# Patient Record
Sex: Female | Born: 1990 | Race: White | Hispanic: No | Marital: Married | Smoking: Never smoker
Health system: Southern US, Community
[De-identification: ages and names within clinical notes are randomized; demographics above are authoritative.]

## PROBLEM LIST (undated history)

## (undated) ENCOUNTER — Inpatient Hospital Stay (HOSPITAL_COMMUNITY): Payer: Self-pay

## (undated) DIAGNOSIS — Z86718 Personal history of other venous thrombosis and embolism: Secondary | ICD-10-CM

## (undated) DIAGNOSIS — D6859 Other primary thrombophilia: Secondary | ICD-10-CM

## (undated) HISTORY — PX: NO PAST SURGERIES: SHX2092

---

## 2008-11-10 DIAGNOSIS — Z86718 Personal history of other venous thrombosis and embolism: Secondary | ICD-10-CM

## 2008-11-10 HISTORY — DX: Personal history of other venous thrombosis and embolism: Z86.718

## 2009-05-24 ENCOUNTER — Ambulatory Visit (HOSPITAL_COMMUNITY): Admission: RE | Admit: 2009-05-24 | Discharge: 2009-05-24 | Payer: Self-pay | Admitting: Obstetrics and Gynecology

## 2009-06-21 ENCOUNTER — Ambulatory Visit (HOSPITAL_COMMUNITY): Admission: RE | Admit: 2009-06-21 | Discharge: 2009-06-21 | Payer: Self-pay | Admitting: Obstetrics and Gynecology

## 2009-07-12 ENCOUNTER — Ambulatory Visit (HOSPITAL_COMMUNITY): Admission: RE | Admit: 2009-07-12 | Discharge: 2009-07-12 | Payer: Self-pay | Admitting: Obstetrics and Gynecology

## 2009-07-25 ENCOUNTER — Inpatient Hospital Stay (HOSPITAL_COMMUNITY): Admission: RE | Admit: 2009-07-25 | Discharge: 2009-07-27 | Payer: Self-pay | Admitting: Obstetrics & Gynecology

## 2009-07-25 ENCOUNTER — Encounter: Payer: Self-pay | Admitting: Advanced Practice Midwife

## 2009-09-24 ENCOUNTER — Ambulatory Visit: Payer: Self-pay | Admitting: Vascular Surgery

## 2009-09-25 ENCOUNTER — Inpatient Hospital Stay (HOSPITAL_COMMUNITY): Admission: EM | Admit: 2009-09-25 | Discharge: 2009-10-03 | Payer: Self-pay | Admitting: Emergency Medicine

## 2009-10-09 ENCOUNTER — Ambulatory Visit (HOSPITAL_COMMUNITY): Admission: RE | Admit: 2009-10-09 | Discharge: 2009-10-09 | Payer: Self-pay | Admitting: Interventional Radiology

## 2010-01-18 ENCOUNTER — Ambulatory Visit (HOSPITAL_COMMUNITY): Admission: RE | Admit: 2010-01-18 | Discharge: 2010-01-18 | Payer: Self-pay | Admitting: Obstetrics and Gynecology

## 2010-05-09 ENCOUNTER — Ambulatory Visit: Payer: Self-pay | Admitting: Oncology

## 2010-05-22 LAB — CBC WITH DIFFERENTIAL/PLATELET
BASO%: 0.6 % (ref 0.0–2.0)
Eosinophils Absolute: 0.1 10*3/uL (ref 0.0–0.5)
LYMPH%: 37.1 % (ref 14.0–49.7)
MCH: 29.5 pg (ref 25.1–34.0)
MCHC: 35 g/dL (ref 31.5–36.0)
MCV: 84.2 fL (ref 79.5–101.0)
MONO%: 7.7 % (ref 0.0–14.0)
Platelets: 337 10*3/uL (ref 145–400)
RBC: 4.57 10*6/uL (ref 3.70–5.45)

## 2010-05-22 LAB — PROTHROMBIN TIME: Prothrombin Time: 23.3 seconds — ABNORMAL HIGH (ref 11.6–15.2)

## 2010-05-26 LAB — HYPERCOAGULABLE PANEL, COMPREHENSIVE
Anticardiolipin IgA: 1 APL U/mL (ref <22)
Anticardiolipin IgG: 9 GPL U/mL (ref <23)
Anticardiolipin IgM: 1 MPL U/mL (ref <11)
Beta-2-Glycoprotein I IgA: 1 A Units (ref <20)
Beta-2-Glycoprotein I IgM: 0 M Units (ref <20)
PTTLA 4:1 Mix: 44.3 secs (ref 30.0–45.6)
Protein C, Total: 60 % — ABNORMAL LOW (ref 70–140)

## 2010-05-26 LAB — COMPREHENSIVE METABOLIC PANEL
AST: 16 U/L (ref 0–37)
BUN: 10 mg/dL (ref 6–23)
Calcium: 9.7 mg/dL (ref 8.4–10.5)
Chloride: 105 mEq/L (ref 96–112)
Creatinine, Ser: 0.58 mg/dL (ref 0.40–1.20)

## 2010-05-26 LAB — FACTOR 8 ASSAY: Coagulation Factor VIII: 54 % — ABNORMAL LOW (ref 73–140)

## 2010-05-29 ENCOUNTER — Ambulatory Visit: Payer: Self-pay | Admitting: Vascular Surgery

## 2010-05-29 ENCOUNTER — Encounter: Payer: Self-pay | Admitting: Oncology

## 2010-05-29 ENCOUNTER — Ambulatory Visit: Admission: RE | Admit: 2010-05-29 | Discharge: 2010-05-29 | Payer: Self-pay | Admitting: Oncology

## 2010-07-01 ENCOUNTER — Emergency Department (HOSPITAL_COMMUNITY): Admission: EM | Admit: 2010-07-01 | Discharge: 2010-07-01 | Payer: Self-pay | Admitting: Emergency Medicine

## 2010-07-02 ENCOUNTER — Ambulatory Visit: Payer: Self-pay | Admitting: Vascular Surgery

## 2010-07-02 ENCOUNTER — Encounter (INDEPENDENT_AMBULATORY_CARE_PROVIDER_SITE_OTHER): Payer: Self-pay | Admitting: Emergency Medicine

## 2010-07-02 ENCOUNTER — Ambulatory Visit (HOSPITAL_COMMUNITY): Admission: RE | Admit: 2010-07-02 | Discharge: 2010-07-02 | Payer: Self-pay | Admitting: Emergency Medicine

## 2010-07-26 ENCOUNTER — Ambulatory Visit: Payer: Self-pay | Admitting: Oncology

## 2010-11-25 IMAGING — US US OB FOLLOW-UP
1 series · 14 of 28 positions shown · non-contrast
Comparison: none

OBSTETRICAL ULTRASOUND:
 This ultrasound was performed in The [HOSPITAL], and the [facility] report will be stored to [REDACTED] PACS.

[Series 1: us ob follow-up · 14 of 31 slices shown]
[im 2/31]
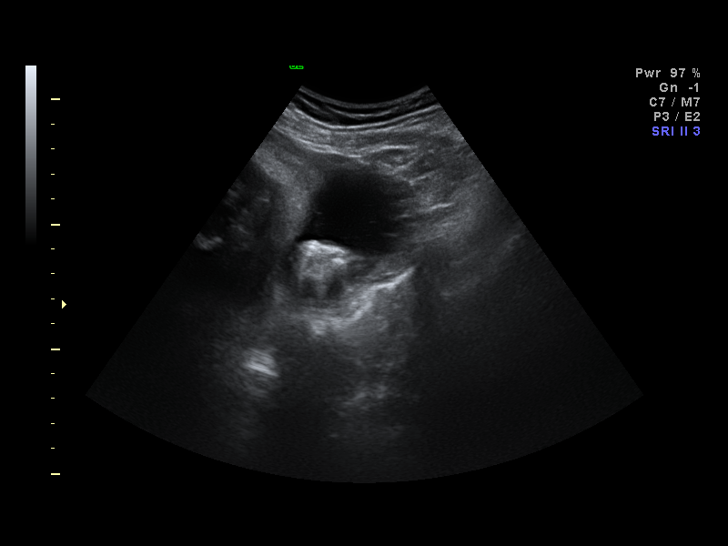
[im 4/31]
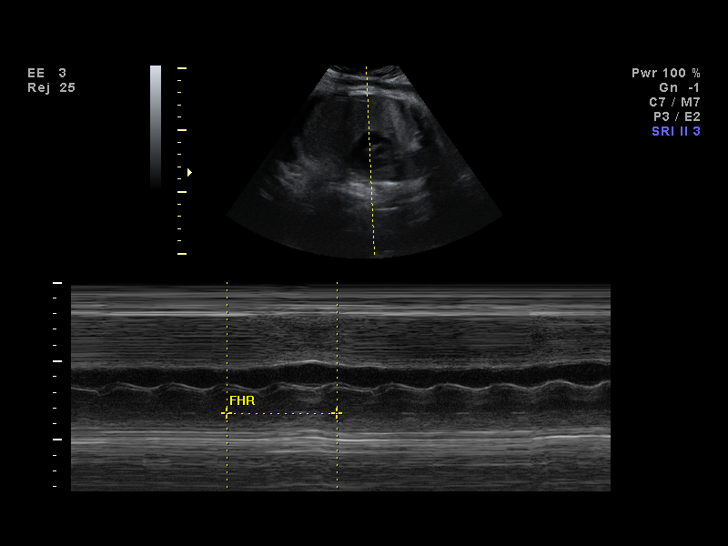
[im 6/31]
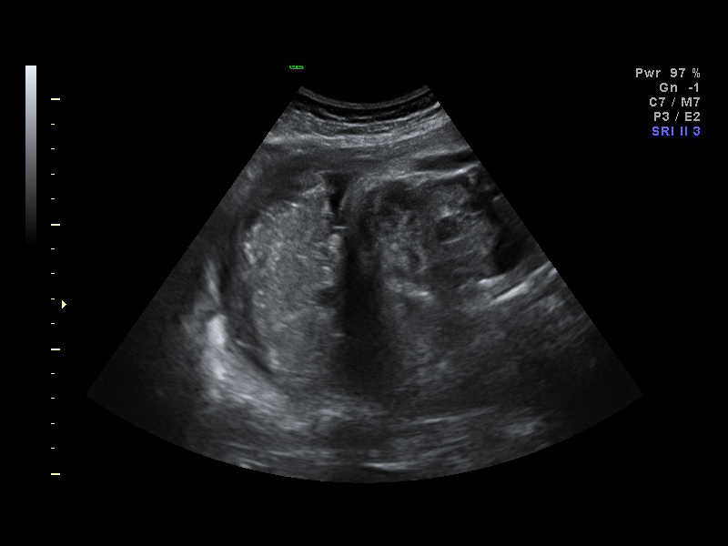
[im 8/31]
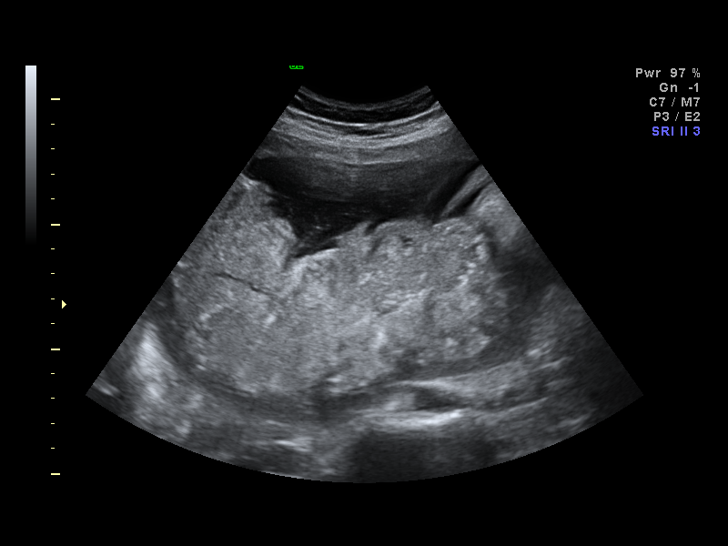
[im 11/31]
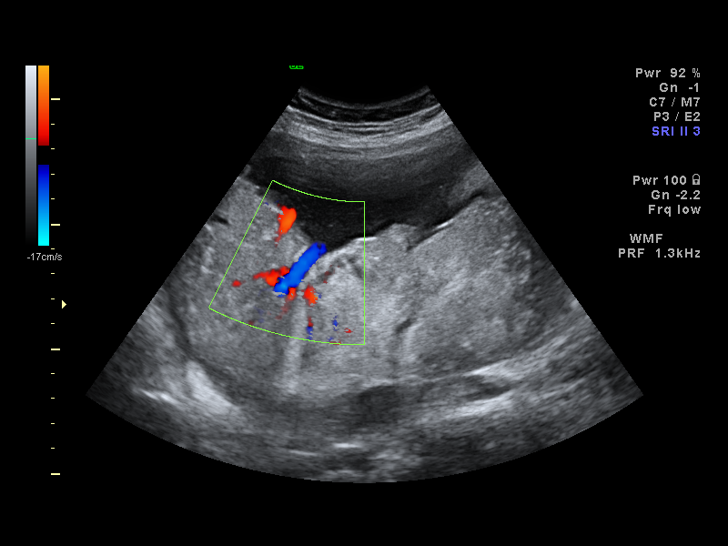
[im 13/31]
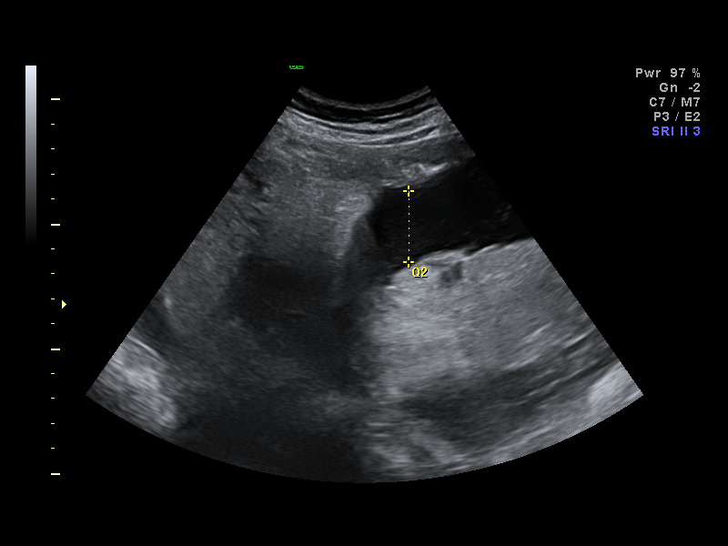
[im 15/31]
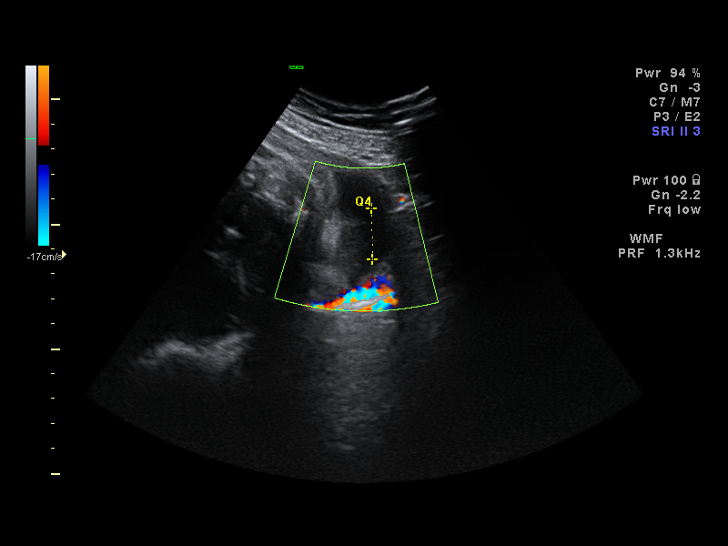
[im 17/31]
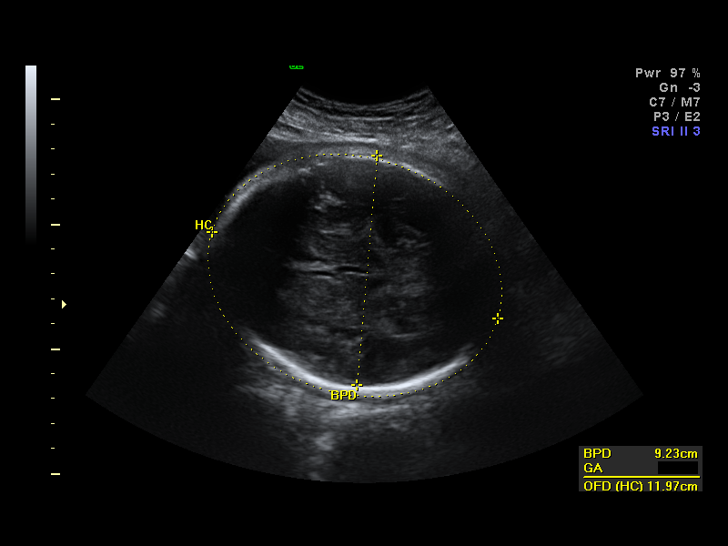
[im 19/31]
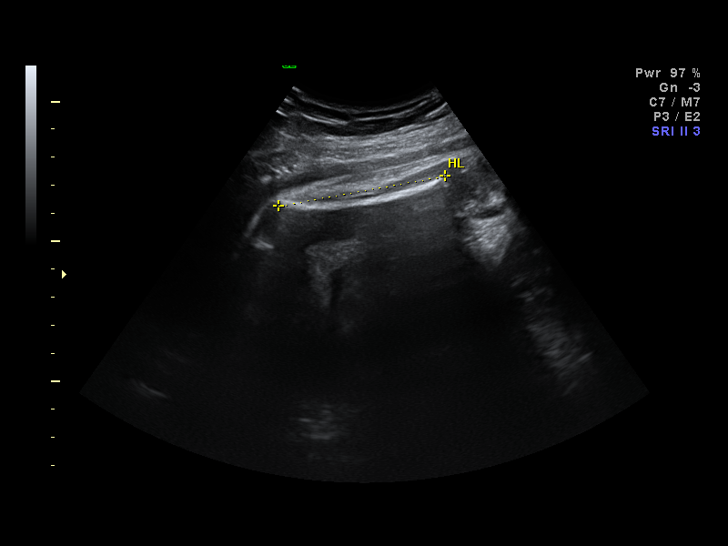
[im 22/31]
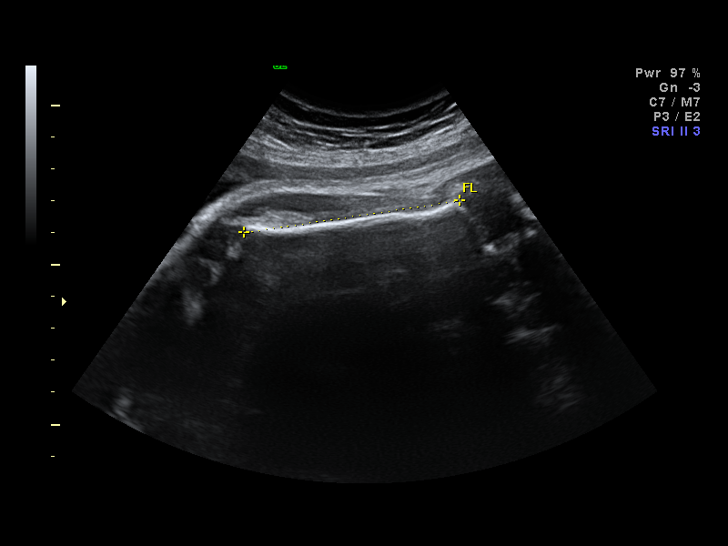
[im 24/31]
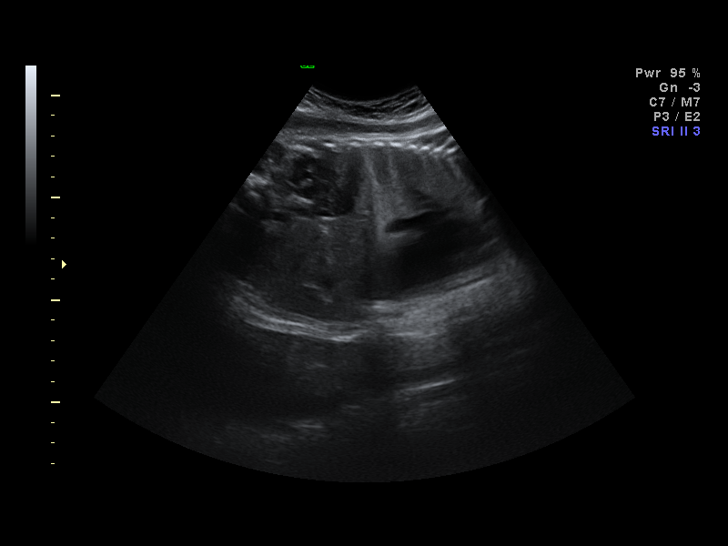
[im 26/31]
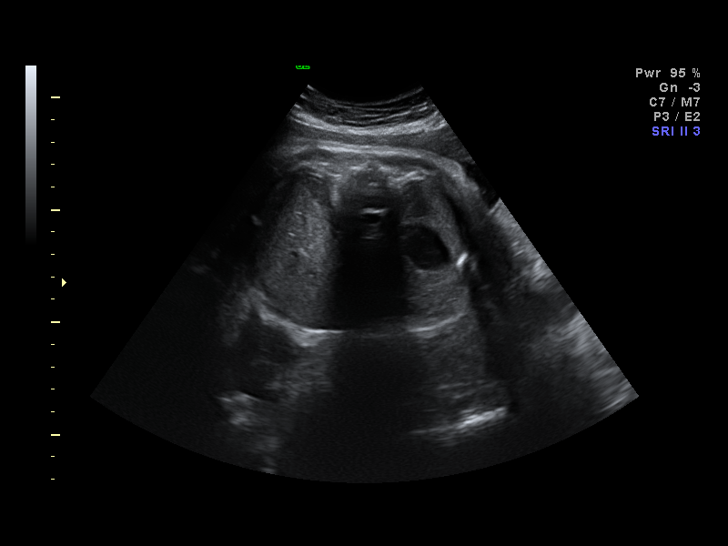
[im 28/31]
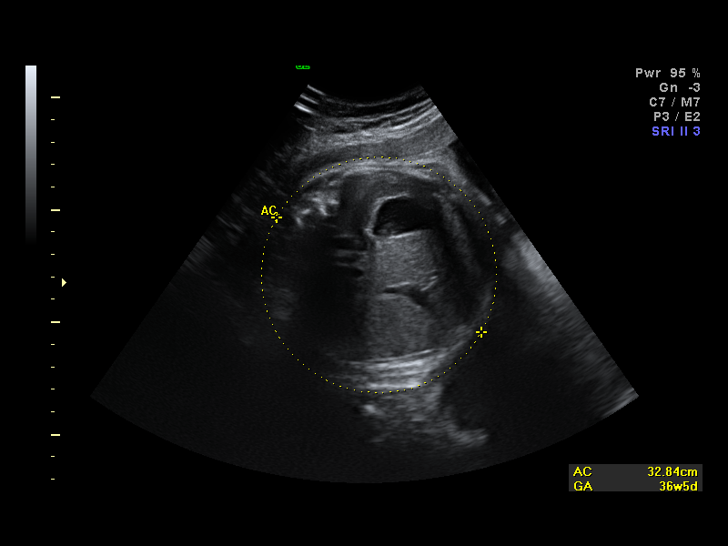
[im 31/31]
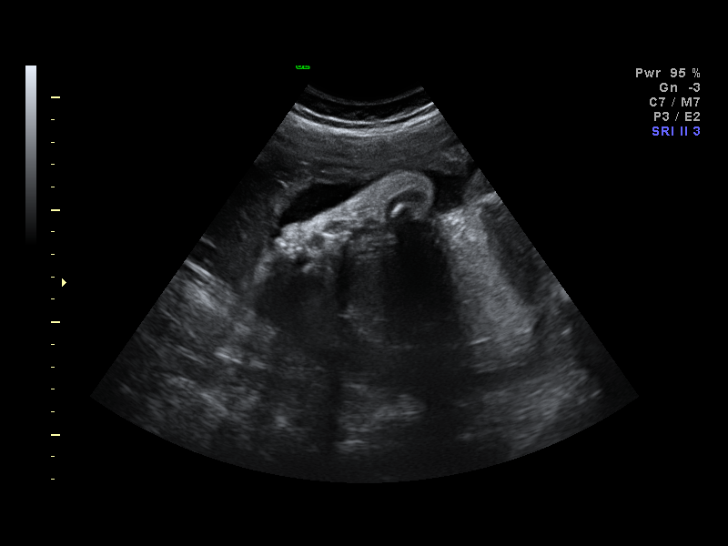

[14 of 28 positions shown; findings below may reference images not displayed]

IMPRESSION: [facility]N has also been faxed to the ordering physician.

## 2010-11-30 ENCOUNTER — Other Ambulatory Visit (HOSPITAL_COMMUNITY): Payer: Self-pay | Admitting: Obstetrics and Gynecology

## 2010-11-30 DIAGNOSIS — Z86718 Personal history of other venous thrombosis and embolism: Secondary | ICD-10-CM

## 2010-11-30 DIAGNOSIS — D6859 Other primary thrombophilia: Secondary | ICD-10-CM

## 2010-11-30 DIAGNOSIS — Z0489 Encounter for examination and observation for other specified reasons: Secondary | ICD-10-CM

## 2010-12-01 ENCOUNTER — Encounter: Payer: Self-pay | Admitting: Interventional Radiology

## 2010-12-12 ENCOUNTER — Ambulatory Visit (HOSPITAL_COMMUNITY)
Admission: RE | Admit: 2010-12-12 | Discharge: 2010-12-12 | Disposition: A | Discharge status: Home / Self Care | Source: Ambulatory Visit | Attending: Obstetrics and Gynecology | Admitting: Obstetrics and Gynecology

## 2010-12-12 ENCOUNTER — Encounter (HOSPITAL_COMMUNITY): Payer: Self-pay

## 2010-12-12 ENCOUNTER — Other Ambulatory Visit (HOSPITAL_COMMUNITY): Payer: Self-pay | Admitting: Maternal and Fetal Medicine

## 2010-12-12 DIAGNOSIS — O358XX0 Maternal care for other (suspected) fetal abnormality and damage, not applicable or unspecified: Secondary | ICD-10-CM | POA: Insufficient documentation

## 2010-12-12 DIAGNOSIS — I82409 Acute embolism and thrombosis of unspecified deep veins of unspecified lower extremity: Secondary | ICD-10-CM | POA: Insufficient documentation

## 2010-12-12 DIAGNOSIS — IMO0002 Reserved for concepts with insufficient information to code with codable children: Secondary | ICD-10-CM

## 2010-12-12 DIAGNOSIS — Z363 Encounter for antenatal screening for malformations: Secondary | ICD-10-CM | POA: Insufficient documentation

## 2010-12-12 DIAGNOSIS — Z1389 Encounter for screening for other disorder: Secondary | ICD-10-CM | POA: Insufficient documentation

## 2010-12-12 DIAGNOSIS — Z86718 Personal history of other venous thrombosis and embolism: Secondary | ICD-10-CM

## 2010-12-12 DIAGNOSIS — D6859 Other primary thrombophilia: Secondary | ICD-10-CM

## 2010-12-12 DIAGNOSIS — Z0489 Encounter for examination and observation for other specified reasons: Secondary | ICD-10-CM

## 2011-01-02 ENCOUNTER — Ambulatory Visit (HOSPITAL_COMMUNITY)
Admission: RE | Admit: 2011-01-02 | Discharge: 2011-01-02 | Disposition: A | Discharge status: Home / Self Care | Source: Ambulatory Visit | Attending: Obstetrics and Gynecology | Admitting: Obstetrics and Gynecology

## 2011-01-02 ENCOUNTER — Other Ambulatory Visit (HOSPITAL_COMMUNITY): Payer: Self-pay | Admitting: Obstetrics and Gynecology

## 2011-01-02 DIAGNOSIS — O358XX0 Maternal care for other (suspected) fetal abnormality and damage, not applicable or unspecified: Secondary | ICD-10-CM

## 2011-01-02 DIAGNOSIS — Z3689 Encounter for other specified antenatal screening: Secondary | ICD-10-CM | POA: Insufficient documentation

## 2011-01-02 DIAGNOSIS — IMO0002 Reserved for concepts with insufficient information to code with codable children: Secondary | ICD-10-CM

## 2011-01-07 ENCOUNTER — Ambulatory Visit (HOSPITAL_COMMUNITY)

## 2011-01-24 ENCOUNTER — Ambulatory Visit (HOSPITAL_COMMUNITY)
Admission: RE | Admit: 2011-01-24 | Discharge: 2011-01-24 | Disposition: A | Discharge status: Home / Self Care | Source: Ambulatory Visit | Attending: Obstetrics and Gynecology | Admitting: Obstetrics and Gynecology

## 2011-01-24 DIAGNOSIS — O358XX0 Maternal care for other (suspected) fetal abnormality and damage, not applicable or unspecified: Secondary | ICD-10-CM | POA: Insufficient documentation

## 2011-01-24 DIAGNOSIS — D6859 Other primary thrombophilia: Secondary | ICD-10-CM | POA: Insufficient documentation

## 2011-01-24 DIAGNOSIS — D689 Coagulation defect, unspecified: Secondary | ICD-10-CM | POA: Insufficient documentation

## 2011-01-31 ENCOUNTER — Ambulatory Visit (HOSPITAL_COMMUNITY)

## 2011-02-10 IMAGING — XA IR ANGIO/EXISTING CATHETER
1 series · 16 of 24 positions shown · non-contrast
Comparison: none

CLINICAL DATA: 39 hours status post initiation of transcatheter
thrombolytic therapy to treat left-sided iliofemoral DVT.  An IVC
filter was also placed on 09/26/2009.

[Series 1: run · 16 of 192 slices shown]
[im 1/192]
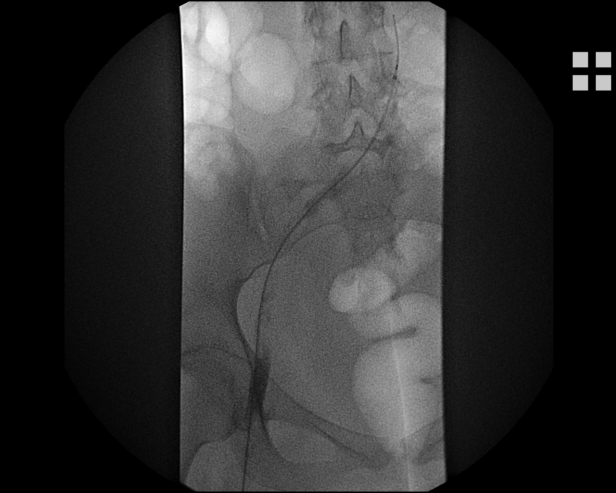
[im 17/192]
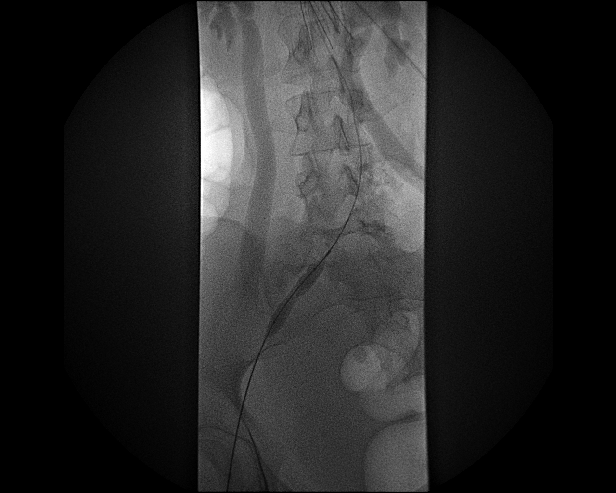
[im 25/192]
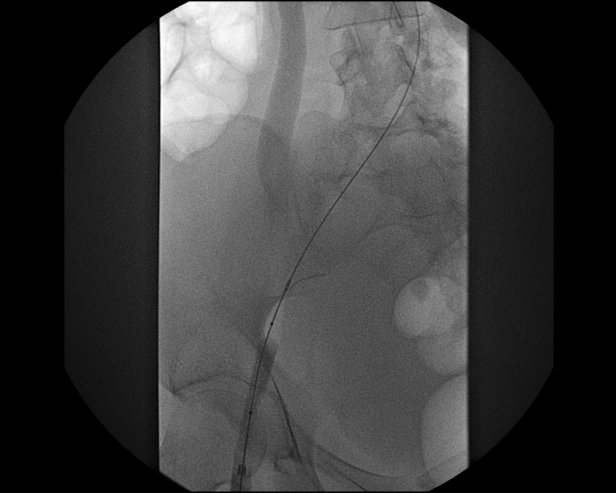
[im 42/192]
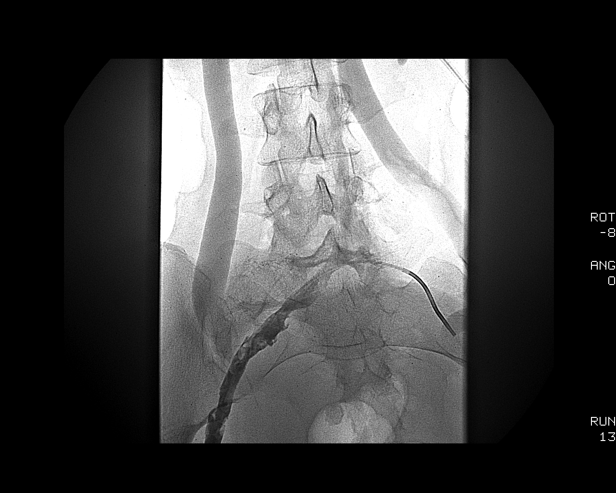
[im 50/192]
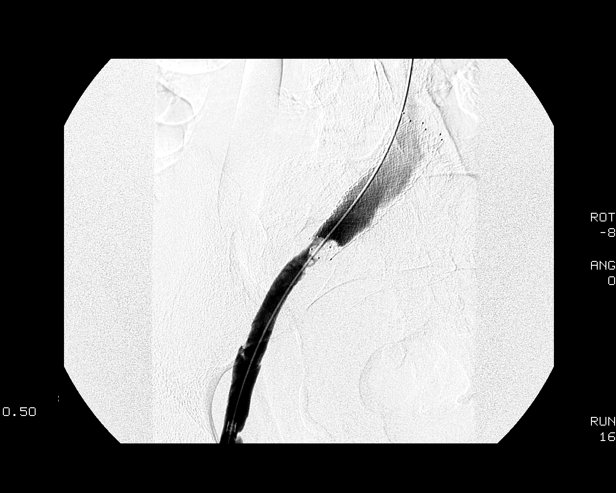
[im 67/192]
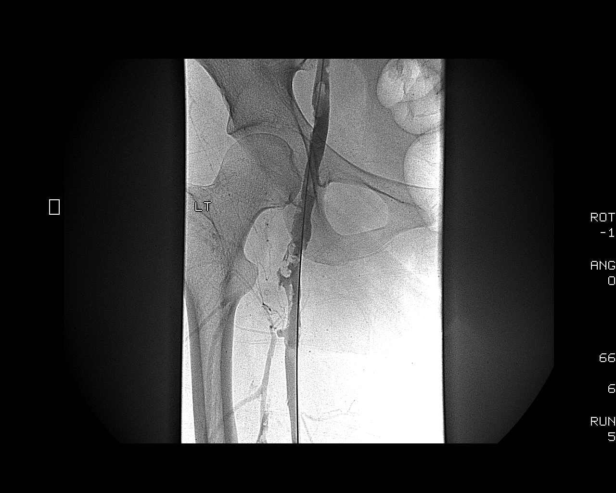
[im 75/192]
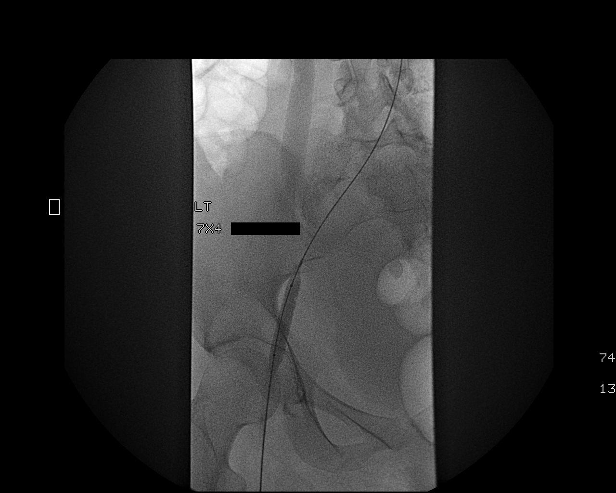
[im 92/192]
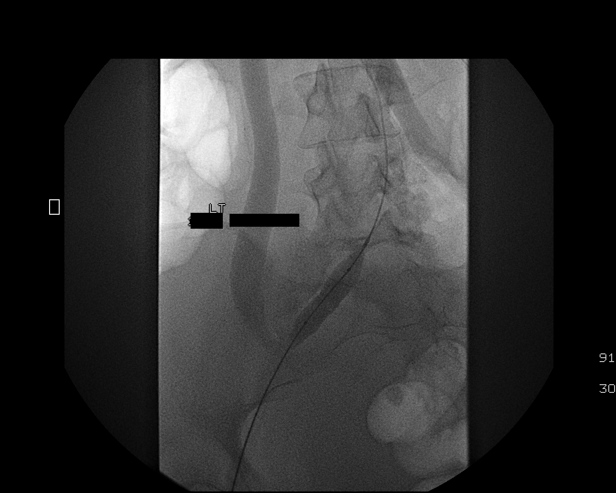
[im 100/192]
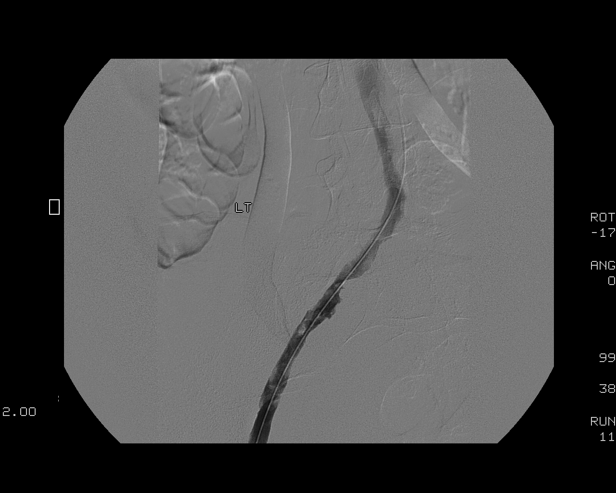
[im 117/192]
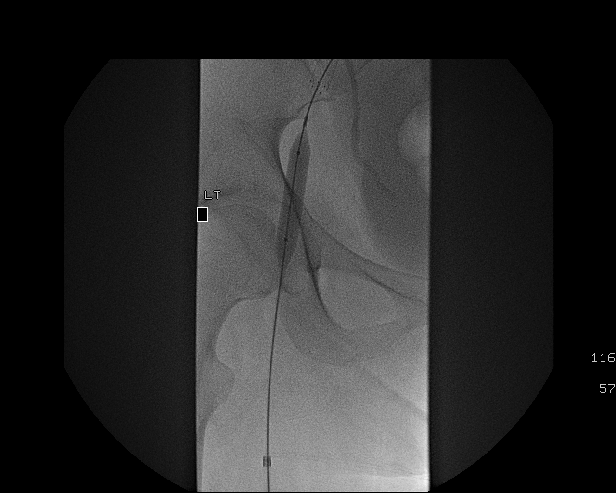
[im 125/192]
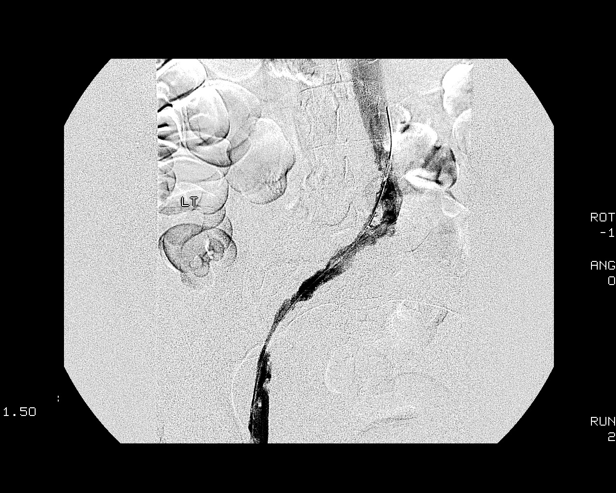
[im 142/192]
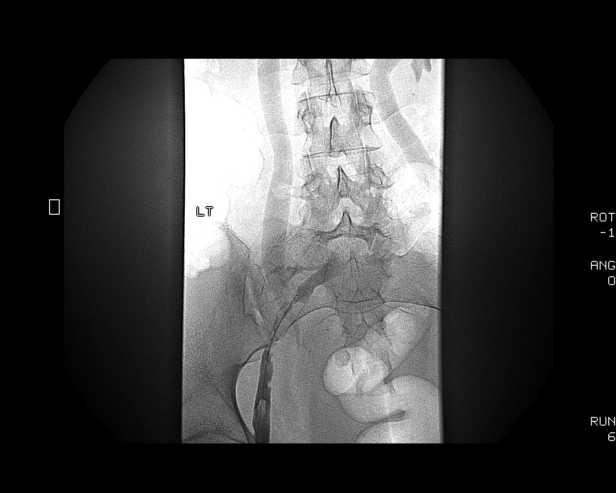
[im 150/192]
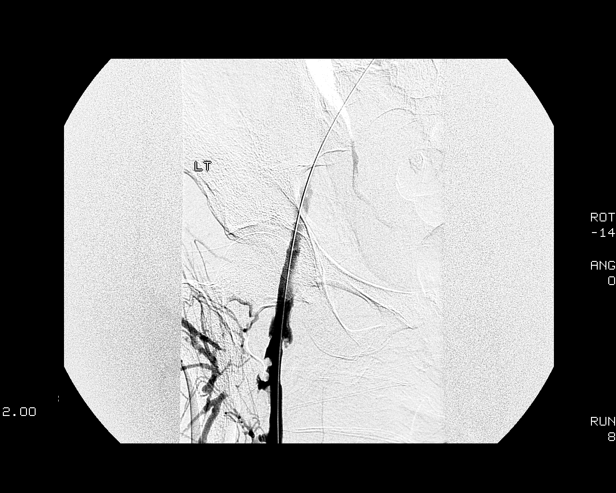
[im 167/192]
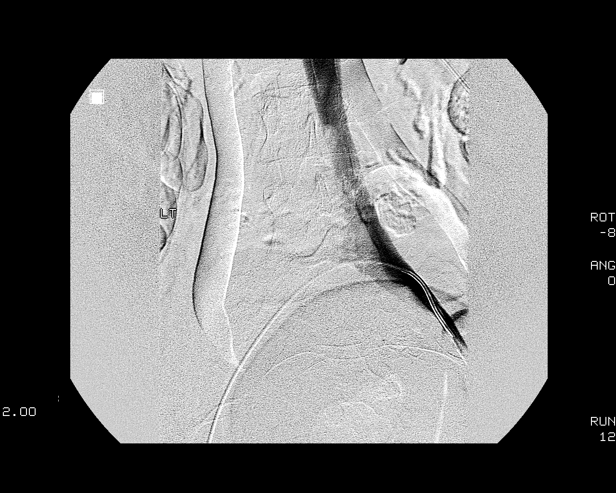
[im 175/192]
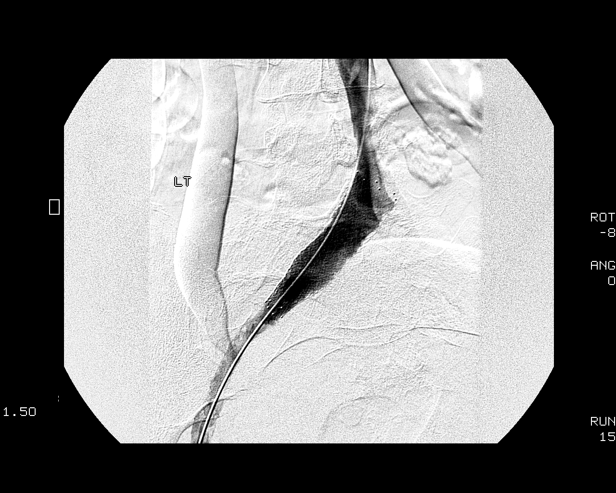
[im 192/192]
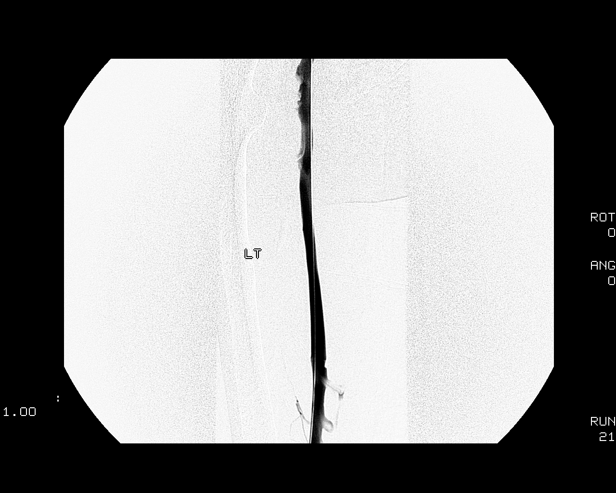

[16 of 24 positions shown; findings below may reference images not displayed]

[DATE].  FOLLOW-UP ANGIOGRAPHY DURING TRANSCATHETER THROMBOLYTIC THERAPY
2.  MECHANICAL THROMBECTOMY OF LEFT FEMORAL AND ILIAC VEINS
3.  VENOUS ANGIOPLASTY OF THE LEFT FEMORAL AND ILIAC VEINS
4.  INTRAVASCULAR STENT PLACEMENT IN THE LEFT ILIAC VEINS

Sedation: 4.5 mg IV Versed; 200 mcg IV Fentanyl.

Total Moderate Sedation Time: 118 minutes.

Contrast:  110 ml Rmnipaque-XMM

Additional Medications: 100 mcg intravenous nitroglycerin

Fluoroscopy Time: 18.8 minutes.

Procedure:  The procedure, risks, benefits, and alternatives were
explained to the patient.  Questions regarding the procedure were
encouraged and answered.  The patient understands and consents to
the procedure.

The popliteal infusion catheter and sheath was prepped with
betadine in a sterile fashion, and a sterile drape was applied
covering the operative field.  A sterile gown and sterile gloves
were used for the procedure. Local anesthesia was provided with 1%
Lidocaine.

Initial venography was performed through the preexisting infusion
catheter as well as injection of the popliteal sheath.  The
infusion catheter was removed over a guidewire.  Mechanical
thrombectomy was performed in the superficial femoral vein, common
femoral vein and iliac veins with the AngioJet device.

Balloon angioplasty was performed at the level of the superficial
femoral vein, common femoral vein and iliac veins initially with a
7 mm x 4 cm Conquest balloon.  The iliac veins were also dilated
with a 10 mm x 4 cm Conquest balloon.  Additional venography was
performed including injection of the right common iliac vein after
advancing a catheter over a guidewire.

Intravascular stent placement was performed at the level of the
left common iliac vein with deployment of a 12 mm x 60 mm Smart
stent.  This was followed by overlapping placement of a 10 mm x 60
mm Smart stent extending into the upper external iliac vein.  Part
of the stented segment was dilated with a 9 mm balloon.

After the procedure the popliteal sheath and right jugular sheath
were removed with hemostasis obtained by manual compression.

Complications: None
FINDINGS: Initial venogram shows significant improved patency of
the deep veins.  Patent flow is now present in the superficial
femoral vein and common femoral vein.  There is nonocclusive
thrombus remaining in the femoral vein just below the
saphenofemoral junction.  Nonocclusive thrombus was also present in
the common femoral vein.  The iliac veins show chronic stenosis of
the common iliac vein and external iliac vein with very poor flow
and residual chronic thrombus.

Some of the residual thrombus in the superficial femoral vein and
common femoral vein did clear with mechanical thrombectomy.  After
additional balloon angioplasty, stenosis of the common iliac vein
showed no improvement.  Intravascular stent placement was therefore
performed.  Overlapping stent was placed.  This extending into the
external iliac vein to treat stenosis and associated chronic
thrombus.  After stent placement, the iliac veins show
significantly improved patency.  Improved flow was also present.

Initial venogram also showed some tilting of the IVC filter placed
yesterday.  The apex of the filter is tilted into the orifice of
the left renal vein.  No significant thrombus was identified in the
filter itself.  The filter will currently be left in place.  After
anticoagulation and discharge from the hospital, elective removal
of the filter will be scheduled.
IMPRESSION: Significant improvement in venous patency after additional
thrombolytic therapy, mechanical thrombectomy, angioplasty and
stenting as above.  Chronic stenosis of the common iliac vein had
to be stented and shows significant improvement.  Thrombolytic
therapy was discontinued.  Additional clinical follow-up will be
performed prior to retrieval of the IVC filter.

## 2011-02-12 LAB — PROTIME-INR
INR: 1.57 — ABNORMAL HIGH (ref 0.00–1.49)
INR: 2.54 — ABNORMAL HIGH (ref 0.00–1.49)
Prothrombin Time: 14.3 seconds (ref 11.6–15.2)
Prothrombin Time: 15.5 seconds — ABNORMAL HIGH (ref 11.6–15.2)
Prothrombin Time: 17.6 seconds — ABNORMAL HIGH (ref 11.6–15.2)
Prothrombin Time: 18.6 seconds — ABNORMAL HIGH (ref 11.6–15.2)

## 2011-02-12 LAB — URINALYSIS, ROUTINE W REFLEX MICROSCOPIC
Hgb urine dipstick: NEGATIVE
Leukocytes, UA: NEGATIVE
Nitrite: NEGATIVE
Protein, ur: 30 mg/dL — AB
Specific Gravity, Urine: >1.046 — ABNORMAL HIGH (ref 1.005–1.030)
Urobilinogen, UA: 0.2 mg/dL (ref 0.0–1.0)
pH: 6 (ref 5.0–8.0)

## 2011-02-12 LAB — CBC
HCT: 24.6 % — ABNORMAL LOW (ref 36.0–46.0)
HCT: 26.4 % — ABNORMAL LOW (ref 36.0–46.0)
HCT: 29 % — ABNORMAL LOW (ref 36.0–46.0)
HCT: 29.1 % — ABNORMAL LOW (ref 36.0–46.0)
Hemoglobin: 7.5 g/dL — ABNORMAL LOW (ref 12.0–15.0)
Hemoglobin: 7.8 g/dL — ABNORMAL LOW (ref 12.0–15.0)
Hemoglobin: 7.9 g/dL — ABNORMAL LOW (ref 12.0–15.0)
Hemoglobin: 8 g/dL — ABNORMAL LOW (ref 12.0–15.0)
Hemoglobin: 8.1 g/dL — ABNORMAL LOW (ref 12.0–15.0)
Hemoglobin: 9.8 g/dL — ABNORMAL LOW (ref 12.0–15.0)
Hemoglobin: 9.9 g/dL — ABNORMAL LOW (ref 12.0–15.0)
MCHC: 33.6 g/dL (ref 30.0–36.0)
MCHC: 33.7 g/dL (ref 30.0–36.0)
MCHC: 33.8 g/dL (ref 30.0–36.0)
MCHC: 33.8 g/dL (ref 30.0–36.0)
MCHC: 34.2 g/dL (ref 30.0–36.0)
MCHC: 34.3 g/dL (ref 30.0–36.0)
MCHC: 34.3 g/dL (ref 30.0–36.0)
MCHC: 34.3 g/dL (ref 30.0–36.0)
MCV: 79.2 fL (ref 78.0–100.0)
MCV: 80.1 fL (ref 78.0–100.0)
MCV: 80.2 fL (ref 78.0–100.0)
MCV: 80.4 fL (ref 78.0–100.0)
MCV: 80.4 fL (ref 78.0–100.0)
Platelets: 277 10*3/uL (ref 150–400)
Platelets: 322 10*3/uL (ref 150–400)
Platelets: 335 10*3/uL (ref 150–400)
RBC: 2.79 MIL/uL — ABNORMAL LOW (ref 3.87–5.11)
RBC: 2.9 MIL/uL — ABNORMAL LOW (ref 3.87–5.11)
RBC: 2.93 MIL/uL — ABNORMAL LOW (ref 3.87–5.11)
RBC: 2.93 MIL/uL — ABNORMAL LOW (ref 3.87–5.11)
RBC: 3.01 MIL/uL — ABNORMAL LOW (ref 3.87–5.11)
RBC: 3.67 MIL/uL — ABNORMAL LOW (ref 3.87–5.11)
RDW: 15.9 % — ABNORMAL HIGH (ref 11.5–15.5)
RDW: 16 % — ABNORMAL HIGH (ref 11.5–15.5)
RDW: 16.2 % — ABNORMAL HIGH (ref 11.5–15.5)
RDW: 16.3 % — ABNORMAL HIGH (ref 11.5–15.5)
WBC: 11.1 10*3/uL — ABNORMAL HIGH (ref 4.0–10.5)
WBC: 6.9 10*3/uL (ref 4.0–10.5)
WBC: 7.9 10*3/uL (ref 4.0–10.5)
WBC: 7.9 10*3/uL (ref 4.0–10.5)
WBC: 8.1 10*3/uL (ref 4.0–10.5)

## 2011-02-12 LAB — BASIC METABOLIC PANEL
BUN: 2 mg/dL — ABNORMAL LOW (ref 6–23)
BUN: 2 mg/dL — ABNORMAL LOW (ref 6–23)
BUN: <1 mg/dL — ABNORMAL LOW (ref 6–23)
CO2: 19 mEq/L (ref 19–32)
CO2: 23 mEq/L (ref 19–32)
Calcium: 7.9 mg/dL — ABNORMAL LOW (ref 8.4–10.5)
Calcium: 8.1 mg/dL — ABNORMAL LOW (ref 8.4–10.5)
Calcium: 9.7 mg/dL (ref 8.4–10.5)
Chloride: 101 mEq/L (ref 96–112)
Chloride: 107 mEq/L (ref 96–112)
Creatinine, Ser: 0.37 mg/dL — ABNORMAL LOW (ref 0.4–1.2)
Creatinine, Ser: 0.52 mg/dL (ref 0.4–1.2)
GFR calc Af Amer: >60 mL/min (ref >60)
GFR calc Af Amer: >60 mL/min (ref >60)
GFR calc Af Amer: >60 mL/min (ref >60)
GFR calc non Af Amer: >60 mL/min (ref >60)
GFR calc non Af Amer: >60 mL/min (ref >60)
Glucose, Bld: 95 mg/dL (ref 70–99)
Glucose, Bld: 97 mg/dL (ref 70–99)
Potassium: 3.1 mEq/L — ABNORMAL LOW (ref 3.5–5.1)
Potassium: 3.8 mEq/L (ref 3.5–5.1)
Potassium: 4.3 mEq/L (ref 3.5–5.1)
Sodium: 134 mEq/L — ABNORMAL LOW (ref 135–145)
Sodium: 138 mEq/L (ref 135–145)

## 2011-02-12 LAB — URINE MICROSCOPIC-ADD ON

## 2011-02-12 LAB — TSH: TSH: 2.696 u[IU]/mL (ref 0.700–6.400)

## 2011-02-12 LAB — HEPARIN LEVEL (UNFRACTIONATED)
Heparin Unfractionated: 0.16 IU/mL — ABNORMAL LOW (ref 0.30–0.70)
Heparin Unfractionated: 0.59 IU/mL (ref 0.30–0.70)
Heparin Unfractionated: <0.1 IU/mL — ABNORMAL LOW (ref 0.30–0.70)
Heparin Unfractionated: <0.1 IU/mL — ABNORMAL LOW (ref 0.30–0.70)

## 2011-02-12 LAB — URINE CULTURE

## 2011-02-12 LAB — FIBRINOGEN: Fibrinogen: 194 mg/dL — ABNORMAL LOW (ref 204–475)

## 2011-02-12 LAB — POCT PREGNANCY, URINE: Preg Test, Ur: NEGATIVE

## 2011-02-12 LAB — MRSA PCR SCREENING: MRSA by PCR: NEGATIVE

## 2011-02-12 LAB — MAGNESIUM: Magnesium: 1.5 mg/dL (ref 1.5–2.5)

## 2011-02-14 LAB — CBC
HCT: 27 % — ABNORMAL LOW (ref 36.0–46.0)
HCT: 33.9 % — ABNORMAL LOW (ref 36.0–46.0)
Hemoglobin: 10.8 g/dL — ABNORMAL LOW (ref 12.0–15.0)
MCV: 81.4 fL (ref 78.0–100.0)
MCV: 83.6 fL (ref 78.0–100.0)
Platelets: 283 10*3/uL (ref 150–400)
Platelets: 319 10*3/uL (ref 150–400)
RBC: 3.31 MIL/uL — ABNORMAL LOW (ref 3.87–5.11)
RDW: 14.8 % (ref 11.5–15.5)
WBC: 12.3 10*3/uL — ABNORMAL HIGH (ref 4.0–10.5)

## 2011-02-14 LAB — APTT: aPTT: 27 seconds (ref 24–37)

## 2011-02-14 LAB — RPR: RPR Ser Ql: NONREACTIVE

## 2012-05-17 IMAGING — US US OB FOLLOW-UP
1 series · 14 of 28 positions shown · non-contrast
Comparison: none

[Series 1: us ob follow-up · 14 of 63 slices shown]
[im 3/63]
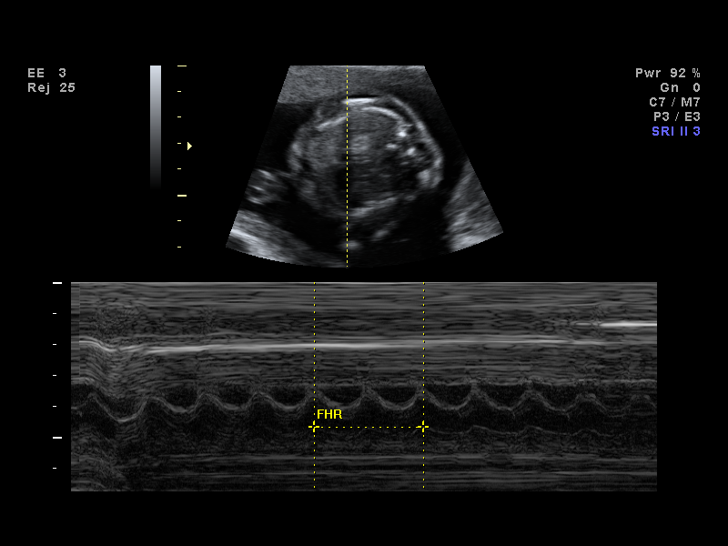
[im 7/63]
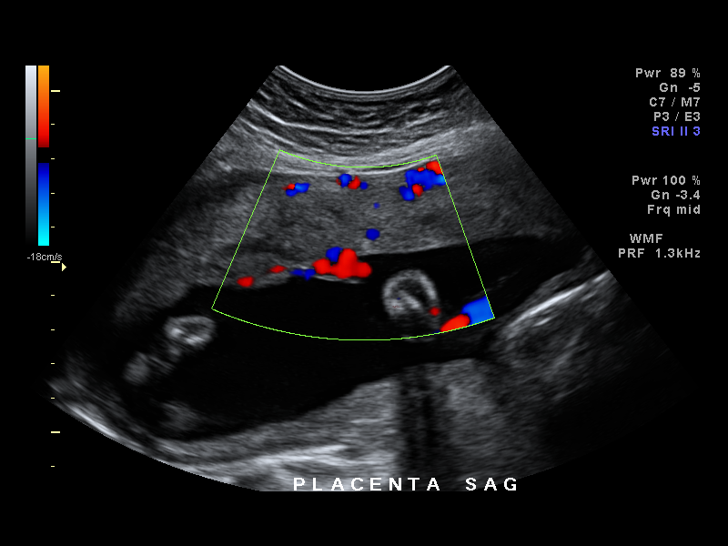
[im 12/63]
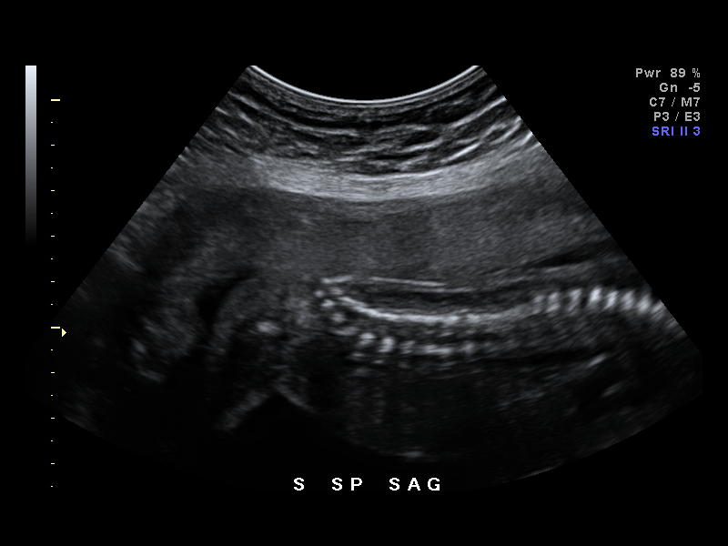
[im 17/63]
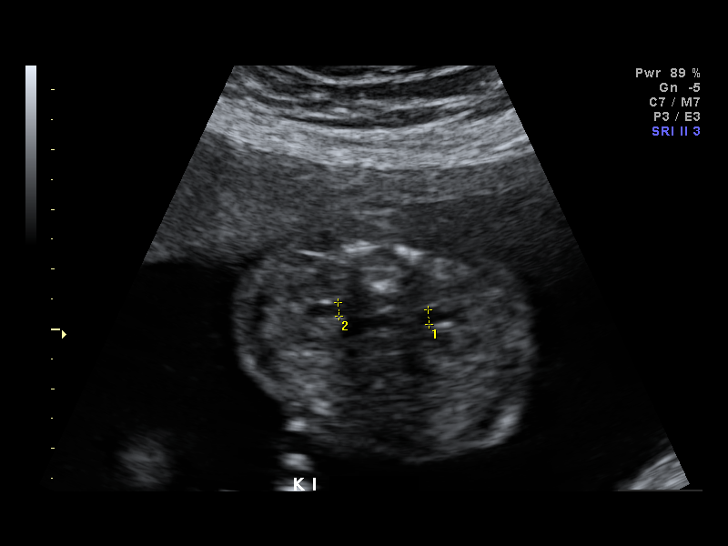
[im 21/63]
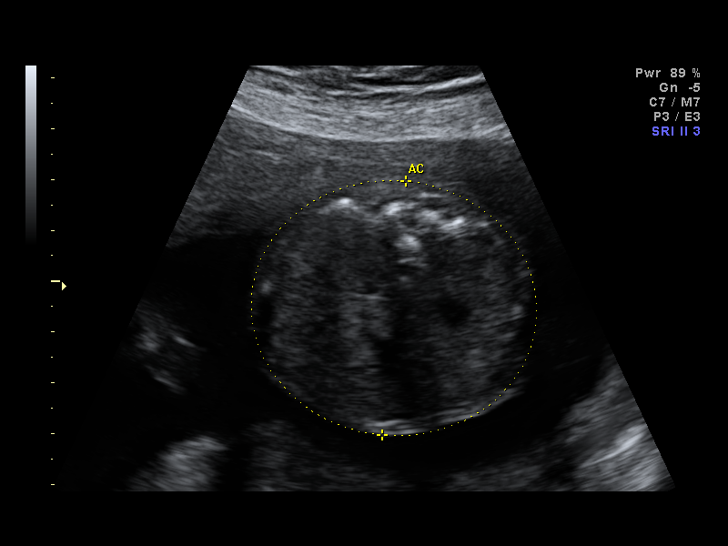
[im 26/63]
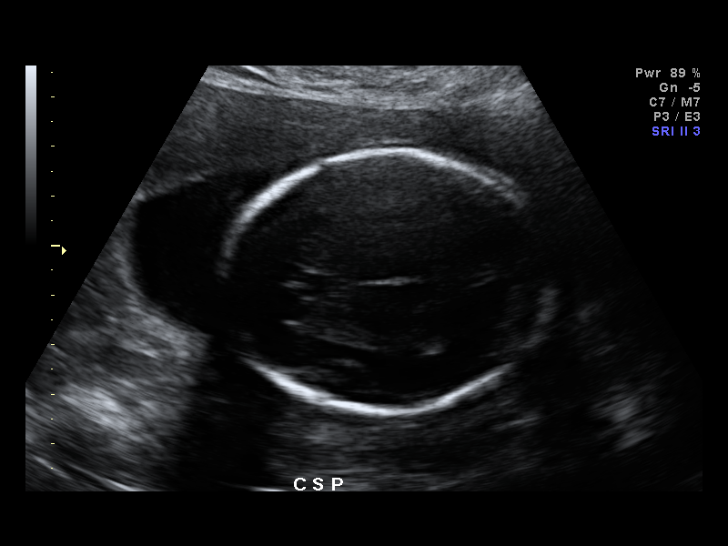
[im 30/63]
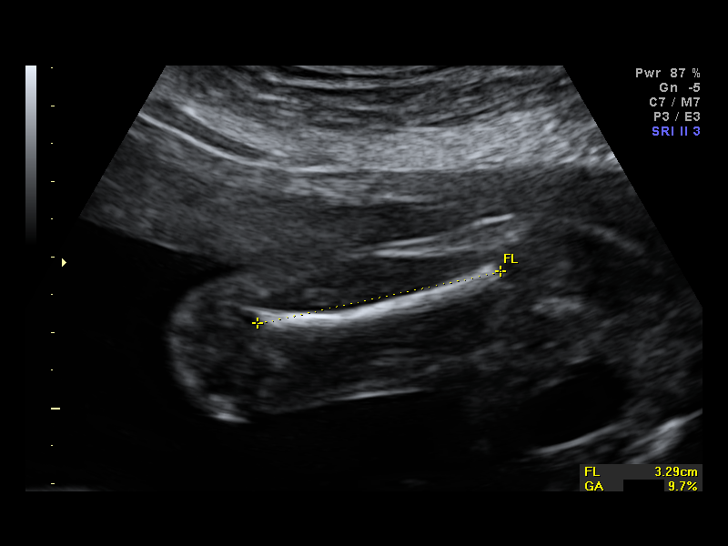
[im 35/63]
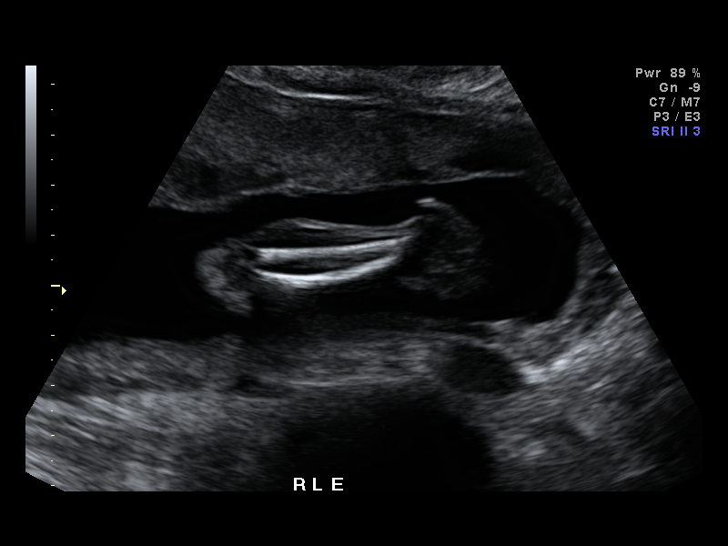
[im 40/63]
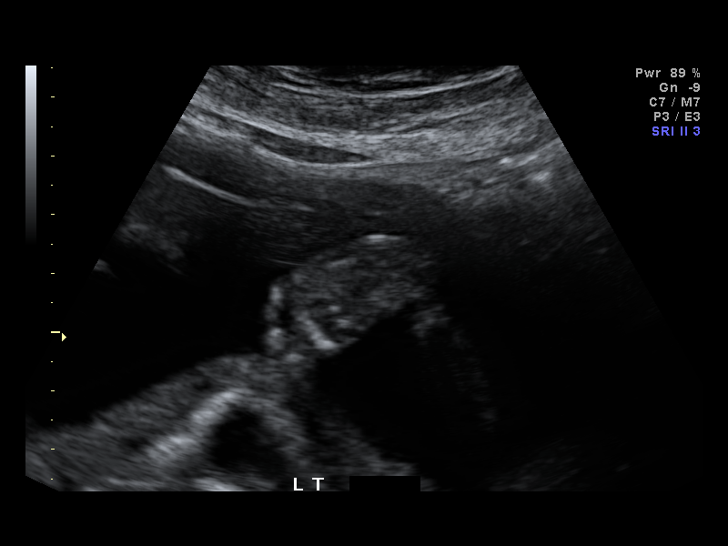
[im 44/63]
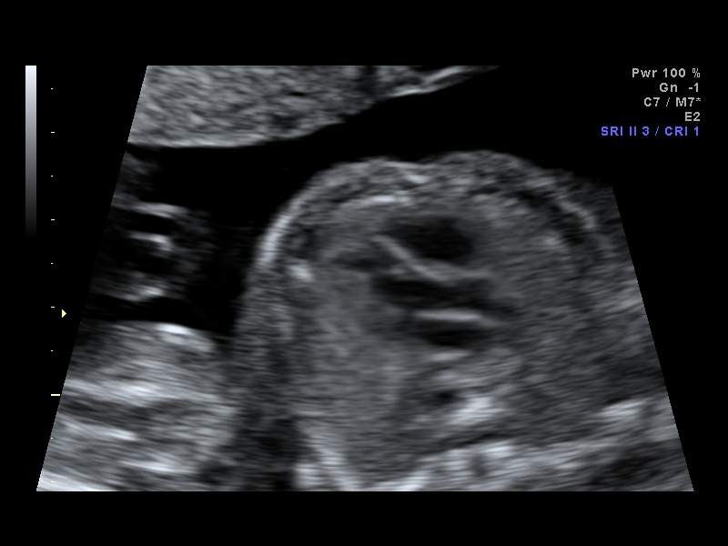
[im 49/63]
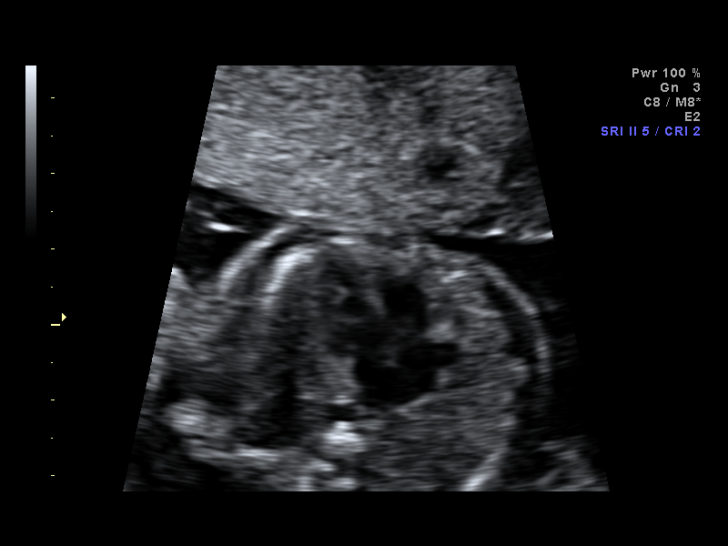
[im 53/63]
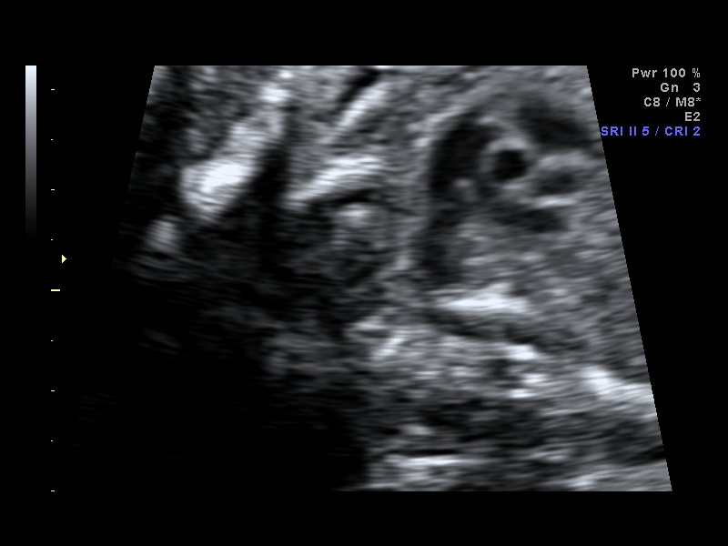
[im 58/63]
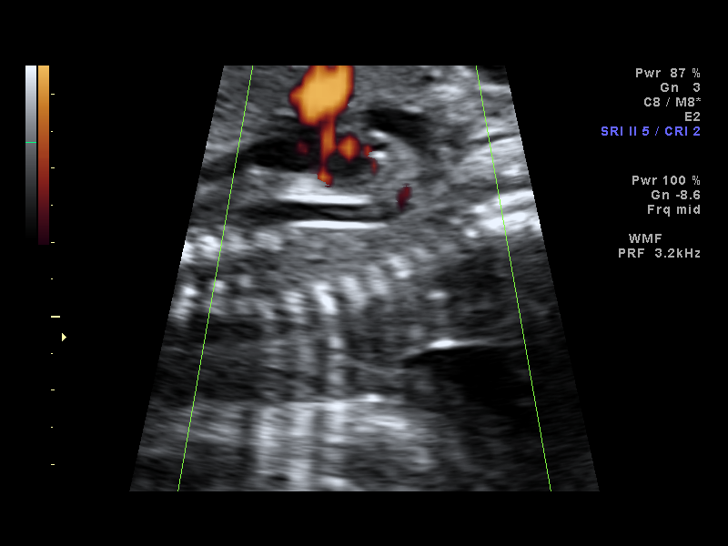
[im 63/63]
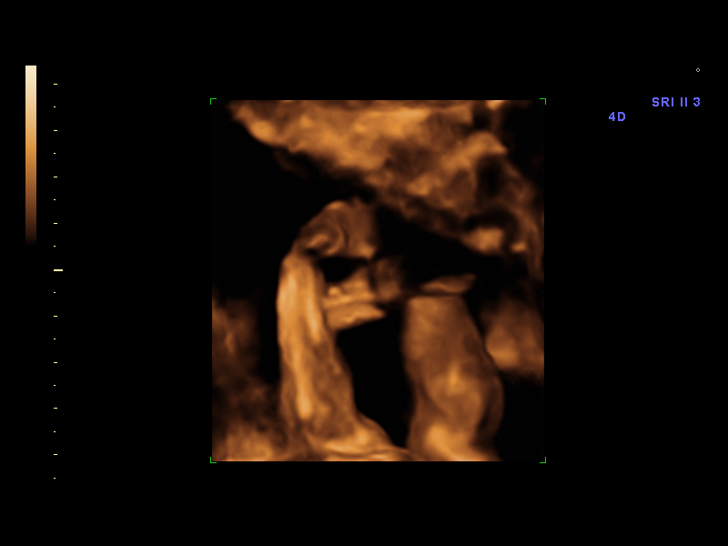

[14 of 28 positions shown; findings below may reference images not displayed]

Canned report from images found in remote index.

Refer to host system for actual result text.

## 2014-06-11 ENCOUNTER — Inpatient Hospital Stay (HOSPITAL_COMMUNITY)

## 2014-06-11 ENCOUNTER — Inpatient Hospital Stay (HOSPITAL_COMMUNITY)
Admission: AD | Admit: 2014-06-11 | Discharge: 2014-06-11 | Disposition: A | Discharge status: Home / Self Care | Source: Ambulatory Visit | Attending: Obstetrics and Gynecology | Admitting: Obstetrics and Gynecology

## 2014-06-11 ENCOUNTER — Encounter (HOSPITAL_COMMUNITY): Payer: Self-pay | Admitting: *Deleted

## 2014-06-11 DIAGNOSIS — D689 Coagulation defect, unspecified: Secondary | ICD-10-CM | POA: Insufficient documentation

## 2014-06-11 DIAGNOSIS — Z86718 Personal history of other venous thrombosis and embolism: Secondary | ICD-10-CM | POA: Insufficient documentation

## 2014-06-11 DIAGNOSIS — O209 Hemorrhage in early pregnancy, unspecified: Secondary | ICD-10-CM | POA: Insufficient documentation

## 2014-06-11 DIAGNOSIS — O99119 Other diseases of the blood and blood-forming organs and certain disorders involving the immune mechanism complicating pregnancy, unspecified trimester: Secondary | ICD-10-CM

## 2014-06-11 DIAGNOSIS — O4692 Antepartum hemorrhage, unspecified, second trimester: Secondary | ICD-10-CM

## 2014-06-11 DIAGNOSIS — Z862 Personal history of diseases of the blood and blood-forming organs and certain disorders involving the immune mechanism: Secondary | ICD-10-CM

## 2014-06-11 HISTORY — DX: Other primary thrombophilia: D68.59

## 2014-06-11 LAB — URINALYSIS, ROUTINE W REFLEX MICROSCOPIC
Bilirubin Urine: NEGATIVE
GLUCOSE, UA: NEGATIVE mg/dL
KETONES UR: NEGATIVE mg/dL
LEUKOCYTES UA: NEGATIVE
Nitrite: NEGATIVE
PROTEIN: 30 mg/dL — AB
Specific Gravity, Urine: 1.015 (ref 1.005–1.030)
Urobilinogen, UA: 1 mg/dL (ref 0.0–1.0)
pH: 7.5 (ref 5.0–8.0)

## 2014-06-11 LAB — URINE MICROSCOPIC-ADD ON

## 2014-06-11 NOTE — MAU Provider Note (Signed)
  History     CSN: [id number]  Arrival date and time: 06/11/14 0945   None     Chief Complaint  Patient presents with  . Vaginal Bleeding   HPI Comments: [id number] @17 .1 wks by early sono c/o bright red bleeding in underwear around 0900 this am. Some mild cramping since arrival. No recent intercourse. No falls or abd trauma. 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 [place] ([place] being followed by MFM there, no Hematologist here yet.    Vaginal Bleeding Associated symptoms include back pain.    OB History   Grav Para Term Preterm Abortions TAB SAB Ect Mult Living   4 3 3       3       Past Medical History  Diagnosis Date  . Protein S deficiency     Past Surgical History  Procedure Laterality Date  . No past surgeries      History reviewed. No pertinent family history.  History  Substance Use Topics  . Smoking status: Never Smoker   . Smokeless tobacco: Never Used  . Alcohol Use: No    Allergies: No Known Allergies  Prescriptions prior to admission  Medication Sig Dispense Refill  . acetaminophen (TYLENOL) 500 MG tablet Take 1,000 mg by mouth every 6 (six) hours as needed for headache.      . enoxaparin (LOVENOX) 60 MG/0.6ML injection Inject 60 mg into the skin every 12 (twelve) hours.      . Prenatal Vit-Fe Fumarate-FA (PRENATAL MULTIVITAMIN) TABS tablet Take 1 tablet by mouth daily at 12 noon.        Review of Systems  Constitutional: Negative.   HENT: Negative.   Eyes: Negative.   Respiratory: Negative.   Cardiovascular: Negative.   Gastrointestinal: Negative.   Genitourinary: Positive for vaginal bleeding.       +VB +cramping  Musculoskeletal: Positive for back pain.  Skin: Negative.   Neurological: Negative.   Endo/Heme/Allergies: Negative.   Psychiatric/Behavioral: Negative.    Physical Exam   Blood pressure 118/79, pulse 103, temperature 98.1 F  (36.7 C), temperature source Oral, resp. rate 18, last menstrual period 02/10/2014, not currently breastfeeding.  Physical Exam  Constitutional: She is oriented to person, place, and time. She appears well-developed and well-nourished.  HENT:  Head: Normocephalic.  Neck: Normal range of motion.  Cardiovascular: Normal rate.   Respiratory: Effort normal.  GI: Soft. There is no tenderness.  Genitourinary: Vagina normal.  Speculum: scant brown blood, cervix closed SVE: 0/0/high  Musculoskeletal: Normal range of motion.  Neurological: She is alert and oriented to person, place, and time.  Skin: Skin is warm and dry.  Psychiatric: She has a normal mood and affect.    MAU Course  Procedures UA-WNL Sono-WNL: placenta above os, CL 3.3cm, central CI, no evidence of bleeding (prelim. report)   Assessment and Plan  17.[redacted] weeks gestation Vaginal bleeding, second trimester Rh positive  Discharge home SAB/bleeding precautions Hematology consult- office will arrange Continue Lovenox at current dose [facility]  Assessment and plan discussed with Dr. Finis Budousins  Taegan Standage, N 06/11/2014, 11:18 AM

## 2014-06-11 NOTE — MAU Note (Signed)
Pt states this am went to restroom and went to void and saw blood in underwear. No clots noted. Has blood clotting disorder. Low back pain as well as mild "period" pains in lower abd.Did not see any blood when voiding in MAU

## 2014-06-11 NOTE — Discharge Instructions (Signed)
Vaginal Bleeding During Pregnancy, Second Trimester °A small amount of bleeding (spotting) from the vagina is relatively common in pregnancy. It usually stops on its own. Various things can cause bleeding or spotting in pregnancy. Some bleeding may be related to the pregnancy, and some may not. Sometimes the bleeding is normal and is not a problem. However, bleeding can also be a sign of something serious. Be sure to tell your health care provider about any vaginal bleeding right away. °Some possible causes of vaginal bleeding during the second trimester include: °· Infection, inflammation, or growths on the cervix.   °· The placenta may be partially or completely covering the opening of the cervix inside the uterus (placenta previa). °· The placenta may have separated from the uterus (abruption of the placenta).   °· You may be having early (preterm) labor.   °· The cervix may not be strong enough to keep a baby inside the uterus (cervical insufficiency).   °· Tiny cysts may have developed in the uterus instead of pregnancy tissue (molar pregnancy).  °HOME CARE INSTRUCTIONS  °Watch your condition for any changes. The following actions may help to lessen any discomfort you are feeling: °· Follow your health care provider's instructions for limiting your activity. If your health care provider orders bed rest, you may need to stay in bed and only get up to use the bathroom. However, your health care provider may allow you to continue light activity. °· If needed, make plans for someone to help with your regular activities and responsibilities while you are on bed rest. °· Keep track of the number of pads you use each day, how often you change pads, and how soaked (saturated) they are. Write this down. °· Do not use tampons. Do not douche. °· Do not have sexual intercourse or orgasms until approved by your health care provider. °· If you pass any tissue from your vagina, save the tissue so you can show it to your  health care provider. °· Only take over-the-counter or prescription medicines as directed by your health care provider. °· Do not take aspirin because it can make you bleed. °· Do not exercise or perform any strenuous activities or heavy lifting without your health care provider's permission. °· Keep all follow-up appointments as directed by your health care provider. °SEEK MEDICAL CARE IF: °· You have any vaginal bleeding during any part of your pregnancy. °· You have cramps or labor pains. °· You have a fever, not controlled by medicine. °SEEK IMMEDIATE MEDICAL CARE IF:  °· You have severe cramps in your back or belly (abdomen). °· You have contractions. °· You have chills. °· You pass large clots or tissue from your vagina. °· Your bleeding increases. °· You feel light-headed or weak, or you have fainting episodes. °· You are leaking fluid or have a gush of fluid from your vagina. °MAKE SURE YOU: °· Understand these instructions. °· Will watch your condition. °· Will get help right away if you are not doing well or get worse. °Document Released: 08/06/2005 Document Revised: 11/01/2013 Document Reviewed: 07/04/2013 °ExitCare® Patient Information ©2015 [employer]. This information is not intended to replace advice given to you by your health care provider. Make sure you discuss any questions you have with your health care provider. ° °

## 2014-08-25 LAB — OB RESULTS CONSOLE ABO/RH: RH Type: POSITIVE

## 2014-08-25 LAB — OB RESULTS CONSOLE HEPATITIS B SURFACE ANTIGEN: Hepatitis B Surface Ag: NEGATIVE

## 2014-08-25 LAB — OB RESULTS CONSOLE RPR: RPR: NONREACTIVE

## 2014-08-25 LAB — OB RESULTS CONSOLE ANTIBODY SCREEN: Antibody Screen: NEGATIVE

## 2014-08-25 LAB — OB RESULTS CONSOLE RUBELLA ANTIBODY, IGM: Rubella: IMMUNE

## 2014-08-25 LAB — OB RESULTS CONSOLE HIV ANTIBODY (ROUTINE TESTING): HIV: NONREACTIVE

## 2014-09-11 ENCOUNTER — Encounter (HOSPITAL_COMMUNITY): Payer: Self-pay | Admitting: *Deleted

## 2014-10-31 LAB — OB RESULTS CONSOLE GBS: STREP GROUP B AG: POSITIVE

## 2014-11-10 NOTE — L&D Delivery Note (Signed)
Delivery Note  First Stage: Admit after successful ECV today Induction with cervical balloon and AROM only - patient preference to avoid pitocin   Labor onset: 1730 with expulsion of cervical balloon and regular ctx at 7cm Augmentation : AROM Analgesia /Anesthesia intrapartum: none AROM at 1917  Second Stage: Complete dilation at 0200 Onset of pushing at 0200 - supine to hands-knees FHR second stage intermittent 150s  Delivery in hands-knees - viable female at 0225 by CNM in LOA position with compound presentation with right arm Loose nuchal cord X1 slid down body with birth Cord double clamped after cessation of pulsation, cut by FOB Cord blood sample collected    Third Stage: Placenta delivered [place] intact with 3 VC @ 0230 with [place] noted Placenta disposition: hospital disposal Uterine tone firm / bleeding small  no laceration identified - perineum and vagina and labia intact Anesthesia for repair: none Repair none Est. Blood Loss (mL): 300  Complications: none  Mom to postpartum.  Baby to Couplet care / Skin to Skin.  Newborn: Birth Weight: 9-8 Apgar Scores: 8-9 Feeding planned: breast  Marlinda Mike CNM, MSN, FACNM 11/12/2014, 2:41 AM

## 2014-11-11 ENCOUNTER — Inpatient Hospital Stay (HOSPITAL_COMMUNITY)
Admission: AD | Admit: 2014-11-11 | Discharge: 2014-11-13 | DRG: 775 | Disposition: A | Discharge status: Home / Self Care | Source: Ambulatory Visit | Attending: Obstetrics & Gynecology | Admitting: Obstetrics & Gynecology

## 2014-11-11 ENCOUNTER — Encounter (HOSPITAL_COMMUNITY): Payer: Self-pay | Admitting: Certified Nurse Midwife

## 2014-11-11 DIAGNOSIS — O3663X0 Maternal care for excessive fetal growth, third trimester, not applicable or unspecified: Secondary | ICD-10-CM | POA: Diagnosis present

## 2014-11-11 DIAGNOSIS — O321XX0 Maternal care for breech presentation, not applicable or unspecified: Secondary | ICD-10-CM | POA: Diagnosis present

## 2014-11-11 DIAGNOSIS — O99824 Streptococcus B carrier state complicating childbirth: Secondary | ICD-10-CM | POA: Diagnosis present

## 2014-11-11 DIAGNOSIS — D6859 Other primary thrombophilia: Secondary | ICD-10-CM | POA: Diagnosis present

## 2014-11-11 DIAGNOSIS — O326XX0 Maternal care for compound presentation, not applicable or unspecified: Secondary | ICD-10-CM | POA: Diagnosis present

## 2014-11-11 DIAGNOSIS — O9912 Other diseases of the blood and blood-forming organs and certain disorders involving the immune mechanism complicating childbirth: Secondary | ICD-10-CM | POA: Diagnosis present

## 2014-11-11 DIAGNOSIS — Z86718 Personal history of other venous thrombosis and embolism: Secondary | ICD-10-CM | POA: Diagnosis not present

## 2014-11-11 DIAGNOSIS — Z7901 Long term (current) use of anticoagulants: Secondary | ICD-10-CM | POA: Diagnosis not present

## 2014-11-11 DIAGNOSIS — Z3A39 39 weeks gestation of pregnancy: Secondary | ICD-10-CM | POA: Diagnosis present

## 2014-11-11 DIAGNOSIS — O99113 Other diseases of the blood and blood-forming organs and certain disorders involving the immune mechanism complicating pregnancy, third trimester: Secondary | ICD-10-CM

## 2014-11-11 DIAGNOSIS — O320XX0 Maternal care for unstable lie, not applicable or unspecified: Secondary | ICD-10-CM | POA: Diagnosis present

## 2014-11-11 HISTORY — DX: Other primary thrombophilia: D68.59

## 2014-11-11 HISTORY — DX: Personal history of other venous thrombosis and embolism: Z86.718

## 2014-11-11 LAB — TYPE AND SCREEN
ABO/RH(D): O POS
ANTIBODY SCREEN: NEGATIVE

## 2014-11-11 LAB — CBC
HCT: 32.7 % — ABNORMAL LOW (ref 36.0–46.0)
Hemoglobin: 10.7 g/dL — ABNORMAL LOW (ref 12.0–15.0)
MCH: 27.4 pg (ref 26.0–34.0)
MCHC: 32.7 g/dL (ref 30.0–36.0)
MCV: 83.6 fL (ref 78.0–100.0)
Platelets: 177 10*3/uL (ref 150–400)
RBC: 3.91 MIL/uL (ref 3.87–5.11)
RDW: 14.6 % (ref 11.5–15.5)
WBC: 5.9 10*3/uL (ref 4.0–10.5)

## 2014-11-11 LAB — SAMPLE TO BLOOD BANK

## 2014-11-11 MED ORDER — OXYTOCIN 40 UNITS IN LACTATED RINGERS INFUSION - SIMPLE MED
62.5000 mL/h | INTRAVENOUS | Status: DC
  Administered 2014-11-12: 62.5 mL/h via INTRAVENOUS
  Filled 2014-11-11: qty 1000

## 2014-11-11 MED ORDER — PENICILLIN G POTASSIUM 5000000 UNITS IJ SOLR
5.0000 10*6.[IU] | Freq: Once | INTRAVENOUS | Status: AC
  Administered 2014-11-11: 5 10*6.[IU] via INTRAVENOUS
  Filled 2014-11-11: qty 5

## 2014-11-11 MED ORDER — LACTATED RINGERS IV SOLN
500.0000 mL | INTRAVENOUS | Status: DC | PRN
  Administered 2014-11-11: 500 mL via INTRAVENOUS

## 2014-11-11 MED ORDER — MISOPROSTOL 200 MCG PO TABS
ORAL_TABLET | ORAL | Status: AC
  Filled 2014-11-11: qty 4

## 2014-11-11 MED ORDER — NALBUPHINE HCL 10 MG/ML IJ SOLN: 10.0000 mg | Freq: Once | INTRAMUSCULAR | Status: AC | PRN

## 2014-11-11 MED ORDER — ACETAMINOPHEN 500 MG PO TABS
1000.0000 mg | ORAL_TABLET | Freq: Four times a day (QID) | ORAL | Status: DC | PRN
Start: 2014-11-11 — End: 2014-11-12

## 2014-11-11 MED ORDER — PRENATAL MULTIVITAMIN CH: 1.0000 | ORAL_TABLET | Freq: Every day | ORAL | Status: DC

## 2014-11-11 MED ORDER — OXYCODONE-ACETAMINOPHEN 5-325 MG PO TABS: 2.0000 | ORAL_TABLET | ORAL | Status: DC | PRN

## 2014-11-11 MED ORDER — OXYTOCIN BOLUS FROM INFUSION
500.0000 mL | INTRAVENOUS | Status: DC
  Administered 2014-11-12: 500 mL via INTRAVENOUS

## 2014-11-11 MED ORDER — PENICILLIN G POTASSIUM 5000000 UNITS IJ SOLR
2.5000 10*6.[IU] | INTRAVENOUS | Status: DC
  Administered 2014-11-11 – 2014-11-12 (×2): 2.5 10*6.[IU] via INTRAVENOUS
  Filled 2014-11-11 (×6): qty 2.5

## 2014-11-11 MED ORDER — OXYCODONE-ACETAMINOPHEN 5-325 MG PO TABS: 1.0000 | ORAL_TABLET | ORAL | Status: DC | PRN

## 2014-11-11 MED ORDER — LIDOCAINE HCL (PF) 1 % IJ SOLN
30.0000 mL | INTRAMUSCULAR | Status: DC | PRN
  Filled 2014-11-11: qty 30

## 2014-11-11 MED ORDER — TERBUTALINE SULFATE 1 MG/ML IJ SOLN
0.2500 mg | Freq: Once | INTRAMUSCULAR | Status: AC
  Administered 2014-11-11: 0.25 mg via SUBCUTANEOUS
  Filled 2014-11-11: qty 1

## 2014-11-11 MED ORDER — CITRIC ACID-SODIUM CITRATE 334-500 MG/5ML PO SOLN: 30.0000 mL | ORAL | Status: DC | PRN

## 2014-11-11 NOTE — Progress Notes (Signed)
S:  agrees with plan for cervical balloon then stripping membranes - then AROM with regular ctx  O:  VS: Blood pressure 115/77, pulse 102, temperature 98.3 F (36.8 C), temperature source Oral, resp. rate 16, height  (1.626 m), weight 90.719 kg (200 lb), last menstrual period 02/10/2014.        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

## 2014-11-11 NOTE — Progress Notes (Signed)
Patient ID: Jasmin Cooper, female   DOB: 1991-05-09, 24 y.o.   MRN: [id number] Admitted for ECV at 39 weeks.  Adequate AFI by sonogram. No contraindication to ECV. Consent done.  Breech confirmed by bedside sono. Reactive NST. Terbutaline [place] given. Forward roll x one attempt successful. Fetal heart tones confirmed before, during and after procedure. FHTs in 140s noted with good BTBV noted. Will proceed with IOL.

## 2014-11-11 NOTE — Progress Notes (Signed)
S:  ctx stronger but not a lot closer - feels like early labor  O:  VS: Blood pressure 130/72, pulse 98, temperature 98.6 F (37 C), temperature source Oral, resp. rate 18, height  (1.626 m), weight 90.719 kg (200 lb), last menstrual period 02/10/2014.        FHR : baseline 145 / variability moderate / accelerations + / no decelerations        Toco: contractions every 5-8 minutes / moderate          Cervical balloon out at 1730        Cervix : 7cm / 90% / vtx OT at -2        Membranes: BBOW - AROM without opening BOW - just nicked bag to leak down fluid        FHR decel with supine positioning for AROM - nadir 90 with recover to 140-150 after position change  A: latent labor     FHR category 1  P: AROM - augment     2nd dose PCN now     Anticipate SVB   Marlinda Mike CNM, MSN, [facility] 11/11/2014, 7:25 PM

## 2014-11-11 NOTE — H&P (Signed)
  OB ADMISSION/ HISTORY & PHYSICAL:  Admission Date: 11/11/2014 12:09 PM  Admit Diagnosis: 39.0 weeks / genetic thrombophilia with hx DVT / breech with unstable lie / successful ECV today / LGA  Jasmin Cooper is a 24 y.o. female presenting for prodromal ctx and recheck of breech presentation - desires to avoid cesarean section. Successful ECV today - desires IOL while vtx. HX 8-9 pound SVB x3 without difficulty. Heparin BID with last dose 9am today.  Prenatal History: [id number]   EDC : 11/18/2014, by Other Basis  Prenatal care at [facility]  Primary Ob Provider: Marlinda Mike CNM and Dr Billy Coast Prenatal course complicated by genetic thrombophilia on Lovenox - hx PP DVT after 1st pregnancy / LGA (EFW 9 1/2 pounds) / borderline poly (AFI 19) / unstable lie - conversion to breech after 36 weeks  Prenatal Labs: ABO, Rh: O/Positive/-- (10/16 0000) Antibody: Negative (10/16 0000) Rubella: Immune (10/16 0000)  RPR: Nonreactive (10/16 0000)  HBsAg: Negative (10/16 0000)  HIV: Non-reactive (10/16 0000)  GTT: NL GBS: Positive (12/22 0000)   Medical / Surgical History :  Past medical history:  Past Medical History  Diagnosis Date  . Protein S deficiency   . History of DVT of lower extremity 2010  . Thrombophilia      Past surgical history:  Past Surgical History  Procedure Laterality Date  . No past surgeries     Family History: History reviewed. No pertinent family history.   Social History:  reports that she has never smoked. She has never used smokeless tobacco. She reports that she does not drink alcohol or use illicit drugs.  Allergies: Review of patient's allergies indicates no known allergies.   Current Medications at time of admission:  Prior to Admission medications   Medication Sig Start Date End Date Taking? Authorizing Provider  heparin 45409 UNIT/ML injection Inject 10,000 Units into the skin every 12 (twelve) hours.   Yes Historical Provider, MD   acetaminophen (TYLENOL) 500 MG tablet Take 1,000 mg by mouth every 6 (six) hours as needed for headache.    Historical Provider, MD  enoxaparin (LOVENOX) 60 MG/0.6ML injection Inject 60 mg into the skin every 12 (twelve) hours.    Historical Provider, MD  Prenatal Vit-Fe Fumarate-FA (PRENATAL MULTIVITAMIN) TABS tablet Take 1 tablet by mouth daily at 12 noon.    Historical Provider, MD   Review of Systems: Active FM Irregular ctx - less than last week but stronger - feels like early labor NO LOF bloody show absent  Physical Exam:  VS: Blood pressure 115/77, pulse 102, temperature 98.3 F (36.8 C), temperature source Oral, resp. rate 16, height  (1.626 m), weight 90.719 kg (200 lb), last menstrual period 02/10/2014.  General: alert and oriented, appears calm and comfortable Heart: RRR Lungs: Clear lung fields Abdomen: Gravid, soft and non-tender, non-distended / uterus: gravid and non-tender Extremities: trace edema  Genitalia / VE: Exam by:: Verdene Lennert  FHR: baseline rate 135 /variability moderate / + accelerations  / no decelerations TOCO: ctx 3-7 minutes mild to moderate  Assessment: 39.[redacted] weeks gestation with genetic thrombophilia in Heparin window Prodromal ctx  Breech presentation - unstable lie in late gestation with successful ECV today LGA with EFW 9 1/2 pounds FHR category 1  Plan:  Admit Labor induction  Dr Billy Coast (primary collaborating MD) and Dr Seymour Bars (on-call) notified of admission / plan of care   Marlinda Mike CNM, MSN, [facility] 11/11/2014, 3:34 PM

## 2014-11-11 NOTE — Progress Notes (Signed)
S:  Ctx becoming painful - pressure in lower back in past hour         + bloody show in bathroom this last void  O:  VS: Blood pressure 124/79, pulse 99, temperature 98 F (36.7 C), temperature source Oral, resp. rate 18, height  (1.626 m), weight 90.719 kg (200 lb), last menstrual period 02/10/2014.        FHR : baseline 150 / variability moderate to decreased / accelerations some 10x10 / no decelerations        Toco: contractions every 2-4 minutes / moderate tostrong         Cervix : 8cm / 90% / vtx 0 station        Membranes: clear fluid with bloody show  A: active labor     FHR category 1-2 (episodes of decreased variability after ECV)  P: anticipate SVB in next 2-3 hours      Possible cord entanglement after ECV       Risk for shoulder dystocia - EFW 9 1/2 pounds - pelvis proven to 8-14 no difficulties     Marlinda Mike CNM, MSN, [facility] 11/11/2014, 10:02 PM

## 2014-11-11 NOTE — MAU Provider Note (Signed)
  History     CSN: [id number]  Arrival date and time: 11/11/14 1209 Provider here to see patient @ 1205 - awaiting patient arrival since 11am    No chief complaint on file.  HPI Irregular ctx Breech in office at last ultrasound Attempting to non-interventional methods at home x 48 hours to facilitate fetal rotation   Past Medical History  Diagnosis Date  . Protein S deficiency    Past Surgical History  Procedure Laterality Date  . No past surgeries     No family history on file.  History  Substance Use Topics  . Smoking status: Never Smoker   . Smokeless tobacco: Never Used  . Alcohol Use: No   Allergies: No Known Allergies  Prescriptions prior to admission  Medication Sig Dispense Refill Last Dose  . acetaminophen (TYLENOL) 500 MG tablet Take 1,000 mg by mouth every 6 (six) hours as needed for headache.   Past Week at Unknown time  . enoxaparin (LOVENOX) 60 MG/0.6ML injection Inject 60 mg into the skin every 12 (twelve) hours.   06/10/2014 at Unknown time  . Prenatal Vit-Fe Fumarate-FA (PRENATAL MULTIVITAMIN) TABS tablet Take 1 tablet by mouth daily at 12 noon.   2 days   ROS  Irregular ctx but much less than last week No vaginal discharge Active FM  Physical Exam   Last menstrual period 02/10/2014.  Physical Exam  Alert and oriented / NAD or pain Abdomen soft and non-tender VE: 1-2cm / posterior / soft / 60% / out of pelvis           no cervical change in comparison to exams in past 2 weeks Extremities no edema  MAU Course  Procedures  1) NST: reactive - baseline 135 / no FHR decels / moderate variability / (+) FHR accels              toco - mild ctx every 3-7 minutes  2) Bedside informal ultrasound:  Assessment and Plan  39 weeks with genetic thrombophilia Heparin status at present pending labor / delivery plan Fetal presentation - breech  Discussed with patient procedure for ECV - risk of failure (40-50%)  and risk for fetal bradycardia or placental  disruption requiring immediate CS. If failure reviewed option for cesarean delivery today versus scheduled for next week - prefers to wait until next week / ROB visit Monday to re-evaluate.  If successful ECV, options reviewed for IOL today versus scheduled IOL with recommendation to do today if baby if cephalic. Prefers not to do pitocin if possible - hx spontaneous labor within 8 hours of membranes being stripped last 2 pregnancies & prefers to try this prior to formal IOL. Lives some distance from hospital - recommend stripping membranes and starting ABX for GBS today then AROM IOL tomorrow with pitocin as augmentation if needed. Agrees with this plan.  TO BS for ECV attempt - Dr Billy Coast notified of status / transfer to [facility] for ECV in next hour.  Marlinda Mike 11/11/2014, 12:11 PM

## 2014-11-11 NOTE — Progress Notes (Signed)
Dr Acey Lav updated on patient admission status, risk factors for pregnancy including HPPH risk, protein C deficiency, hx of DVT, thrombophila, last dose and time of Heparin, Protein S and current labs. Placed T&S, no additional orders given at this time.

## 2014-11-11 NOTE — MAU Note (Signed)
Assumed care of patient.

## 2014-11-11 NOTE — MAU Note (Signed)
Pt states here for u/s to see if baby is still breech to determine if she can have version.

## 2014-11-12 ENCOUNTER — Encounter (HOSPITAL_COMMUNITY): Payer: Self-pay | Admitting: *Deleted

## 2014-11-12 LAB — CREATININE, SERUM
Creatinine, Ser: 0.66 mg/dL (ref 0.50–1.10)
GFR calc Af Amer: >90 mL/min (ref >90)
GFR calc non Af Amer: >90 mL/min (ref >90)

## 2014-11-12 LAB — ABO/RH: ABO/RH(D): O POS

## 2014-11-12 LAB — RPR

## 2014-11-12 MED ORDER — DIBUCAINE 1 % RE OINT: 1.0000 "application " | TOPICAL_OINTMENT | RECTAL | Status: DC | PRN

## 2014-11-12 MED ORDER — WITCH HAZEL-GLYCERIN EX PADS: 1.0000 "application " | MEDICATED_PAD | CUTANEOUS | Status: DC | PRN

## 2014-11-12 MED ORDER — SENNOSIDES-DOCUSATE SODIUM 8.6-50 MG PO TABS
2.0000 | ORAL_TABLET | ORAL | Status: DC
  Administered 2014-11-13: 2 via ORAL
  Filled 2014-11-12: qty 2

## 2014-11-12 MED ORDER — BENZOCAINE-MENTHOL 20-0.5 % EX AERO: 1.0000 "application " | INHALATION_SPRAY | CUTANEOUS | Status: DC | PRN

## 2014-11-12 MED ORDER — OXYCODONE-ACETAMINOPHEN 5-325 MG PO TABS: 1.0000 | ORAL_TABLET | ORAL | Status: DC | PRN

## 2014-11-12 MED ORDER — LANOLIN HYDROUS EX OINT: TOPICAL_OINTMENT | CUTANEOUS | Status: DC | PRN

## 2014-11-12 MED ORDER — OXYCODONE-ACETAMINOPHEN 5-325 MG PO TABS: 2.0000 | ORAL_TABLET | ORAL | Status: DC | PRN

## 2014-11-12 MED ORDER — POLYSACCHARIDE IRON COMPLEX 150 MG PO CAPS
150.0000 mg | ORAL_CAPSULE | Freq: Every day | ORAL | Status: DC
  Administered 2014-11-12 – 2014-11-13 (×2): 150 mg via ORAL
  Filled 2014-11-12 (×2): qty 1

## 2014-11-12 MED ORDER — MAGNESIUM OXIDE 400 (241.3 MG) MG PO TABS
200.0000 mg | ORAL_TABLET | Freq: Two times a day (BID) | ORAL | Status: DC
  Administered 2014-11-12 – 2014-11-13 (×3): 200 mg via ORAL
  Filled 2014-11-12 (×5): qty 0.5

## 2014-11-12 MED ORDER — ENOXAPARIN SODIUM 40 MG/0.4ML ~~LOC~~ SOLN
40.0000 mg | SUBCUTANEOUS | Status: DC
  Administered 2014-11-12 – 2014-11-13 (×2): 40 mg via SUBCUTANEOUS
  Filled 2014-11-12 (×3): qty 0.4

## 2014-11-12 MED ORDER — IBUPROFEN 800 MG PO TABS
800.0000 mg | ORAL_TABLET | ORAL | Status: AC
  Administered 2014-11-12: 800 mg via ORAL
  Filled 2014-11-12: qty 1

## 2014-11-12 NOTE — Progress Notes (Signed)
S:  Pressure with ctx - no urge to push but starting to feel more pressure towards rectum  O:  VS: Blood pressure 131/88, pulse 91, temperature 98 F (36.7 C), temperature source Oral, resp. rate 20, height  (1.626 m), weight 90.719 kg (200 lb), last menstrual period 02/10/2014.        FHR : baseline 145 / variability moderate / accelerations + / no decelerations        Toco: contractions every 2-3 minutes / strong         Cervix : 8-9cm / 90% / vtx 0 station by nurse 20 minutes ago        Membranes: clear fluid leakage  A: active labor     FHR category 1  P: reposition to hands-knees to facilitate fetal rotation and stronger urge to push      Intermittent FHR with position changes   Marlinda Mike CNM, MSN, FACNM 11/12/2014, 0130am

## 2014-11-12 NOTE — Lactation Note (Signed)
This note was copied from the chart of Jasmin Cooper. Lactation Consultation Note  Patient Name: Jasmin Cooper [id number] Date: 11/12/2014 Reason for consult: Initial assessment   Initial consult at 19 hours old; GA 39.1; BW 9 lbs, 8 oz.  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  Infant has breastfed x8 (10-15 min); voids-2; stools-2 since birth 19 hrs ago.  LS-10 by RN.  Infant was STS with mom when LC entered room asleep and not showing feeding cues.  Mom denied any soreness or pain.  Reviewed feeding with cues 8-12 times per day; cluster feeding and size of infant's stomach.  Lactation brochure given and informed of hospital support group.  Encouraged mom to call for questions or assistance as needed.  Follow-up as PRN.   Maternal Data Formula Feeding for Exclusion: No Does the patient have breastfeeding experience prior to this delivery?: Yes   Consult Status Consult Status: PRN    Lendon Ka 11/12/2014, 10:23 PM

## 2014-11-13 MED ORDER — POLYSACCHARIDE IRON COMPLEX 150 MG PO CAPS: 150.0000 mg | ORAL_CAPSULE | Freq: Every day | ORAL | Status: AC

## 2014-11-13 MED ORDER — ENOXAPARIN SODIUM 60 MG/0.6ML ~~LOC~~ SOLN: 40.0000 mg | Freq: Two times a day (BID) | SUBCUTANEOUS | Status: AC

## 2014-11-13 MED ORDER — MAGNESIUM OXIDE 400 (241.3 MG) MG PO TABS: 200.0000 mg | ORAL_TABLET | Freq: Every day | ORAL | Status: AC

## 2014-11-13 NOTE — Discharge Instructions (Signed)
Breast Pumping Tips °If you are breastfeeding, there may be times when you cannot feed your baby directly. Returning to work or going on a trip are common examples. Pumping allows you to store breast milk and feed it to your baby later.  °You may not get much milk when you first start to pump. Your breasts should start to make more after a few days. If you pump at the times you usually feed your baby, you may be able to keep making enough milk to feed your baby without also using formula. The more often you pump, the more milk you will produce.  °WHEN SHOULD I PUMP?  °· You can begin to pump soon after delivery. However, some experts recommend waiting about 4 weeks before giving your infant a bottle to make sure breastfeeding is going well.  °· If you plan to return to work, begin pumping a few weeks before. This will help you develop techniques that work best for you. It also lets you build up a supply of breast milk.   °· When you are with your infant, feed on demand and pump after each feeding.   °· When you are away from your infant for several hours, pump for about 15 minutes every 2-3 hours. Pump both breasts at the same time if you can.   °· If your infant has a formula feeding, make sure to pump around the same time.     °· If you drink any alcohol, wait 2 hours before pumping.   °HOW DO I PREPARE TO PUMP? °Your let-down reflex is the natural reaction to stimulation that makes your breast milk flow. It is easier to stimulate this reflex when you are relaxed. Find relaxation techniques that work for you. If you have difficulty with your let-down reflex, try these methods:  °· Smell one of your infant's blankets or an item of clothing.   °· Look at a picture or video of your infant.   °· Sit in a quiet, private space.   °· Massage the breast you plan to pump.   °· Place soothing warmth on the breast.   °· Play relaxing music.   °WHAT ARE SOME GENERAL BREAST PUMPING TIPS? °· Wash your hands before you pump. You  do not need to wash your nipples or breasts. °· There are three ways to pump. °¨ You can use your hand to massage and compress your breast. °¨ You can use a handheld manual pump. °¨ You can use an electric pump.   °· Make sure the suction cup (flange) on the breast pump is the right size. Place the flange directly over the nipple. If it is the wrong size or placed the wrong way, it may be painful and cause nipple damage.   °· If pumping is uncomfortable, apply a small amount of purified or modified lanolin to your nipple and areola. °· If you are using an electric pump, adjust the speed and suction power to be more comfortable. °· If pumping is painful or if you are not getting very much milk, you may need a different type of pump. A lactation consultant can help you determine what type of pump to use.   °· Keep a full water bottle near you at all times. Drinking lots of fluid helps you make more milk.  °· You can store your milk to use later. Pumped breast milk can be stored in a sealable, sterile container or plastic bag. Label all stored breast milk with the date you pumped it. °¨ Milk can stay out at room temperature for up to 8 hours. °¨   You can store your milk in the refrigerator for up to 8 days. °¨ You can store your milk in the freezer for 3 months. Thaw frozen milk using warm water. Do not put it in the microwave. °· Do not smoke. Smoking can lower your milk supply and harm your infant. If you need help quitting, ask your health care provider to recommend a program.   °WHEN SHOULD I CALL MY HEALTH CARE PROVIDER OR A LACTATION CONSULTANT? °· You are having trouble pumping. °· You are concerned that you are not making enough milk. °· You have nipple pain, soreness, or redness. °· You want to use birth control. Birth control pills may lower your milk supply. Talk to your health care provider about your options. °Document Released: 04/16/2010 Document Revised: 11/01/2013 Document Reviewed:  08/19/2013 °Exit[employer]® Patient Information ©2015 [employer]. This information is not intended to replace advice given to you by your health care provider. Make sure you discuss any questions you have with your health care provider. ° °Nutrition for the New Mother  °A new mother needs good health and nutrition so she can have energy to take care of a new baby. Whether a mother breastfeeds or formula feeds the baby, it is important to have a well-balanced diet. Foods from all the food groups should be chosen to meet the new mother's energy needs and to give her the nutrients needed for repair and healing.  °A HEALTHY EATING PLAN °The My [employer] plan for Moms outlines what you should eat to help you and your baby stay healthy. The energy and amount of food you need depends on whether or not you are breastfeeding. If you are breastfeeding you will need more nutrients. If you choose not to breastfeed, your nutrition goal should be to return to a healthy weight. Limiting calories may be needed if you are not breastfeeding.  °HOME CARE INSTRUCTIONS  °· For a personal plan based on your unique needs, see your Registered Dietitian or visit www.mypyramid.gov. °· Eat a variety of foods. The plan below will help guide you. The following chart has a suggested daily meal plan from the [employer] for Moms. °· Eat a variety of fruits and vegetables. °· Eat more dark green and orange vegetables and cooked dried beans. °· Make half your grains whole grains. Choose whole instead of refined grains. °· Choose low-fat or lean meats and poultry. °· Choose low-fat or fat-free dairy products like milk, cheese, or yogurt. °Fruits °· Breastfeeding: 2 cups °· Non-Breastfeeding: 2 cups °· What Counts as a serving? °¨ 1 cup of fruit or juice. °¨ ½ cup dried fruit. °Vegetables °· Breastfeeding: 3 cups °· Non-Breastfeeding: 2 ½ cups °· What Counts as a serving? °¨ 1 cup raw or cooked vegetables. °¨ Juice or 2 cups raw leafy  vegetables. °Grains °· Breastfeeding: 8 oz °· Non-Breastfeeding: 6 oz °· What Counts as a serving? °¨ 1 slice bread. °¨ 1 oz ready-to-eat cereal. °¨ ½ cup cooked pasta, rice, or cereal. °Meat and Beans °· Breastfeeding: 6 ½ oz °· Non-Breastfeeding: 5 ½ oz °· What Counts as a serving? °¨ 1 oz lean meat, poultry, or fish °¨ ¼ cup cooked dry beans °¨ ½ oz nuts or 1 egg °¨ 1 tbs peanut butter °Milk °· Breastfeeding: 3 cups °· Non-Breastfeeding: 3 cups °· What Counts as a serving? °¨ 1 cup milk. °¨ 8 oz yogurt. °¨ 1 ½ oz cheese. °¨ 2 oz processed cheese. °TIPS FOR THE BREASTFEEDING MOM °· Rapid weight   loss is not suggested when you are breastfeeding. By simply breastfeeding, you will be able to lose the weight gained during your pregnancy. Your caregiver can keep track of your weight and tell you if your weight loss is appropriate. °· Be sure to drink fluids. You may notice that you are thirstier than usual. A suggestion is to drink a glass of water or other beverage whenever you breastfeed. °· Avoid alcohol as it can be passed into your breast milk. °· Limit caffeine drinks to no more than 2 to 3 cups per day. °· You may need to keep taking your prenatal vitamin while you are breastfeeding. Talk with your caregiver about taking a vitamin or supplement. °RETURING TO A HEALTHY WEIGHT °· The My [employer] Plan for Moms will help you return to a healthy weight. It will also provide the nutrients you need. °· You may need to limit "empty" calories. These include: °¨ High fat foods like fried foods, fatty meats, fast food, butter, and mayonnaise. °¨ High sugar foods like sodas, jelly, candy, and sweets. °· Be physically active. Include 30 minutes of exercise or more each day. Choose an activity you like such as walking, swimming, biking, or aerobics. Check with your caregiver before you start to exercise. °Document Released: 02/03/2008 Document Revised: 01/19/2012 Document Reviewed: 02/03/2008 °ExitCare® Patient Information  ©2015 [employer]. This information is not intended to replace advice given to you by your health care provider. Make sure you discuss any questions you have with your health care provider. °Postpartum Depression and Baby Blues °The postpartum period begins right after the birth of a baby. During this time, there is often a great amount of joy and excitement. It is also a time of many changes in the life of the parents. Regardless of how many times a mother gives birth, each child brings new challenges and dynamics to the family. It is not unusual to have feelings of excitement along with confusing shifts in moods, emotions, and thoughts. All mothers are at risk of developing postpartum depression or the "baby blues." These mood changes can occur right after giving birth, or they may occur many months after giving birth. The baby blues or postpartum depression can be mild or severe. Additionally, postpartum depression can go away rather quickly, or it can be a long-term condition.  °CAUSES °Raised hormone levels and the rapid drop in those levels are thought to be a main cause of postpartum depression and the baby blues. A number of hormones change during and after pregnancy. Estrogen and progesterone usually decrease right after the delivery of your baby. The levels of thyroid hormone and various cortisol steroids also rapidly drop. Other factors that play a role in these mood changes include major life events and genetics.  °RISK FACTORS °If you have any of the following risks for the baby blues or postpartum depression, know what symptoms to watch out for during the postpartum period. Risk factors that may increase the likelihood of getting the baby blues or postpartum depression include: °· Having a personal or family history of depression.   °· Having depression while being pregnant.   °· Having premenstrual mood issues or mood issues related to oral contraceptives. °· Having a lot of life stress.   °· Having  marital conflict.   °· Lacking a social support network.   °· Having a baby with special needs.   °· Having health problems, such as diabetes.   °SIGNS AND SYMPTOMS °Symptoms of baby blues include: °· Brief changes in mood, such as going   from extreme happiness to sadness. °· Decreased concentration.   °· Difficulty sleeping.   °· Crying spells, tearfulness.   °· Irritability.   °· Anxiety.   °Symptoms of postpartum depression typically begin within the first month after giving birth. These symptoms include: °· Difficulty sleeping or excessive sleepiness.   °· Marked weight loss.   °· Agitation.   °· Feelings of worthlessness.   °· Lack of interest in activity or food.   °Postpartum psychosis is a very serious condition and can be dangerous. Fortunately, it is rare. Displaying any of the following symptoms is cause for immediate medical attention. Symptoms of postpartum psychosis include:  °· Hallucinations and delusions.   °· Bizarre or disorganized behavior.   °· Confusion or disorientation.   °DIAGNOSIS  °A diagnosis is made by an evaluation of your symptoms. There are no medical or lab tests that lead to a diagnosis, but there are various questionnaires that a health care provider may use to identify those with the baby blues, postpartum depression, or psychosis. Often, a screening tool called the Edinburgh Postnatal Depression Scale is used to diagnose depression in the postpartum period.  °TREATMENT °The baby blues usually goes away on its own in 1-2 weeks. Social support is often all that is needed. You will be encouraged to get adequate sleep and rest. Occasionally, you may be given medicines to help you sleep.  °Postpartum depression requires treatment because it can last several months or longer if it is not treated. Treatment may include individual or group therapy, medicine, or both to address any social, physiological, and psychological factors that may play a role in the depression. Regular exercise, a  healthy diet, rest, and social support may also be strongly recommended.  °Postpartum psychosis is more serious and needs treatment right away. Hospitalization is often needed. °HOME CARE INSTRUCTIONS °· Get as much rest as you can. Nap when the baby sleeps.   °· Exercise regularly. Some women find yoga and walking to be beneficial.   °· Eat a balanced and nourishing diet.   °· Do little things that you enjoy. Have a cup of tea, take a bubble bath, read your favorite magazine, or listen to your favorite music. °· Avoid alcohol.   °· Ask for help with household chores, cooking, grocery shopping, or running errands as needed. Do not try to do everything.   °· Talk to people close to you about how you are feeling. Get support from your partner, family members, friends, or other new moms. °· Try to stay positive in how you think. Think about the things you are grateful for.   °· Do not spend a lot of time alone.   °· Only take over-the-counter or prescription medicine as directed by your health care provider. °· Keep all your postpartum appointments.   °· Let your health care provider know if you have any concerns.   °SEEK MEDICAL CARE IF: °You are having a reaction to or problems with your medicine. °SEEK IMMEDIATE MEDICAL CARE IF: °· You have suicidal feelings.   °· You think you may harm the baby or someone else. °MAKE SURE YOU: °· Understand these instructions. °· Will watch your condition. °· Will get help right away if you are not doing well or get worse. °Document Released: 07/31/2004 Document Revised: 11/01/2013 Document Reviewed: 08/08/2013 °Exit[employer]® Patient Information ©2015 [employer]. This information is not intended to replace advice given to you by your health care provider. Make sure you discuss any questions you have with your health care provider. °Breastfeeding and Mastitis °Mastitis is inflammation of the breast tissue. It can occur in women who   are breastfeeding. This can make breastfeeding  painful. Mastitis will sometimes go away on its own. Your health care provider will help determine if treatment is needed. °CAUSES °Mastitis is often associated with a blocked milk (lactiferous) duct. This can happen when too much milk builds up in the breast. Causes of excess milk in the breast can include: °· Poor latch-on. If your baby is not latched onto the breast properly, she or he may not empty your breast completely while breastfeeding. °· Allowing too much time to pass between feedings. °· Wearing a bra or other clothing that is too tight. This puts extra pressure on the lactiferous ducts so milk does not flow through them as it should. °Mastitis can also be caused by a bacterial infection. Bacteria may enter the breast tissue through cuts or openings in the skin. In women who are breastfeeding, this may occur because of cracked or irritated skin. Cracks in the skin are often caused when your baby does not latch on properly to the breast. °SIGNS AND SYMPTOMS °· Swelling, redness, tenderness, and pain in an area of the breast. °· Swelling of the glands under the arm on the same side. °· Fever may or may not accompany mastitis. °If an infection is allowed to progress, a collection of pus (abscess) may develop. °DIAGNOSIS  °Your health care provider can usually diagnose mastitis based on your symptoms and a physical exam. Tests may be done to help confirm the diagnosis. These may include: °· Removal of pus from the breast by applying pressure to the area. This pus can be examined in the lab to determine which bacteria are present. If an abscess has developed, the fluid in the abscess can be removed with a needle. This can also be used to confirm the diagnosis and determine the bacteria present. In most cases, pus will not be present. °· Blood tests to determine if your body is fighting a bacterial infection. °· Mammogram or ultrasound tests to rule out other problems or diseases. °TREATMENT  °Mastitis that  occurs with breastfeeding will sometimes go away on its own. Your health care provider may choose to wait 24 hours after first seeing you to decide whether a prescription medicine is needed. If your symptoms are worse after 24 hours, your health care provider will likely prescribe an antibiotic medicine to treat the mastitis. He or she will determine which bacteria are most likely causing the infection and will then select an appropriate antibiotic medicine. This is sometimes changed based on the results of tests performed to identify the bacteria, or if there is no response to the antibiotic medicine selected. Antibiotic medicines are usually given by mouth. You may also be given medicine for pain. °HOME CARE INSTRUCTIONS °· Only take over-the-counter or prescription medicines for pain, fever, or discomfort as directed by your health care provider. °· If your health care provider prescribed an antibiotic medicine, take the medicine as directed. Make sure you finish it even if you start to feel better. °· Do not wear a tight or underwire bra. Wear a soft, supportive bra. °· Increase your fluid intake, especially if you have a fever. °· Continue to empty the breast. Your health care provider can tell you whether this milk is safe for your infant or needs to be thrown out. You may be told to stop nursing until your health care provider thinks it is safe for your baby. Use a breast pump if you are advised to stop nursing. °· Keep your nipples   clean and dry. °· Empty the first breast completely before going to the other breast. If your baby is not emptying your breasts completely for some reason, use a breast pump to empty your breasts. °· If you go back to work, pump your breasts while at work to stay in time with your nursing schedule. °· Avoid allowing your breasts to become overly filled with milk (engorged). °SEEK MEDICAL CARE IF: °· You have pus-like discharge from the breast. °· Your symptoms do not improve with  the treatment prescribed by your health care provider within 2 days. °SEEK IMMEDIATE MEDICAL CARE IF: °· Your pain and swelling are getting worse. °· You have pain that is not controlled with medicine. °· You have a red line extending from the breast toward your armpit. °· You have a fever or persistent symptoms for more than 2-3 days. °· You have a fever and your symptoms suddenly get worse. °MAKE SURE YOU:  °· Understand these instructions. °· Will watch your condition. °· Will get help right away if you are not doing well or get worse. °Document Released: 02/21/2005 Document Revised: 11/01/2013 Document Reviewed: 06/02/2013 °[employer]® Patient Information ©2015 [employer]. This information is not intended to replace advice given to you by your health care provider. Make sure you discuss any questions you have with your health care provider. °Breastfeeding °Deciding to breastfeed is one of the best choices you can make for you and your baby. A change in hormones during pregnancy causes your breast tissue to grow and increases the number and size of your milk ducts. These hormones also allow proteins, sugars, and fats from your blood supply to make breast milk in your milk-producing glands. Hormones prevent breast milk from being released before your baby is born as well as prompt milk flow after birth. Once breastfeeding has begun, thoughts of your baby, as well as his or her sucking or crying, can stimulate the release of milk from your milk-producing glands.  °BENEFITS OF BREASTFEEDING °For Your Baby °· Your first milk (colostrum) helps your baby's digestive system function better.   °· There are antibodies in your milk that help your baby fight off infections.   °· Your baby has a lower incidence of asthma, allergies, and sudden infant death syndrome.   °· The nutrients in breast milk are better for your baby than infant formulas and are designed uniquely for your baby's needs.   °· Breast milk improves your  baby's brain development.   °· Your baby is less likely to develop other conditions, such as childhood obesity, asthma, or type 2 diabetes mellitus.   °For You  °· Breastfeeding helps to create a very special bond between you and your baby.   °· Breastfeeding is convenient. Breast milk is always available at the correct temperature and costs nothing.   °· Breastfeeding helps to burn calories and helps you lose the weight gained during pregnancy.   °· Breastfeeding makes your uterus contract to its prepregnancy size faster and slows bleeding (lochia) after you give birth.   °· Breastfeeding helps to lower your risk of developing type 2 diabetes mellitus, osteoporosis, and breast or ovarian cancer later in life. °SIGNS THAT YOUR BABY IS HUNGRY °Early Signs of Hunger  °· Increased alertness or activity. °· Stretching. °· Movement of the head from side to side. °· Movement of the head and opening of the mouth when the corner of the mouth or cheek is stroked (rooting). °· Increased sucking sounds, smacking lips, cooing, sighing, or squeaking. °· Hand-to-mouth movements. °· Increased sucking of   fingers or hands. °Late Signs of Hunger °· Fussing. °· Intermittent crying. °Extreme Signs of Hunger °Signs of extreme hunger will require calming and consoling before your baby will be able to breastfeed successfully. Do not wait for the following signs of extreme hunger to occur before you initiate breastfeeding:   °· Restlessness. °· A loud, strong cry. °·  Screaming. °BREASTFEEDING BASICS °Breastfeeding Initiation °· Find a comfortable place to sit or lie down, with your neck and back well supported. °· Place a pillow or rolled up blanket under your baby to bring him or her to the level of your breast (if you are seated). Nursing pillows are specially designed to help support your arms and your baby while you breastfeed. °· Make sure that your baby's abdomen is facing your abdomen.   °· Gently massage your breast. With your  fingertips, massage from your chest wall toward your nipple in a circular motion. This encourages milk flow. You may need to continue this action during the feeding if your milk flows slowly. °· Support your breast with 4 fingers underneath and your thumb above your nipple. Make sure your fingers are well away from your nipple and your baby's mouth.   °· Stroke your baby's lips gently with your finger or nipple.   °· When your baby's mouth is open wide enough, quickly bring your baby to your breast, placing your entire nipple and as much of the colored area around your nipple (areola) as possible into your baby's mouth.   °¨ More areola should be visible above your baby's upper lip than below the lower lip.   °¨ Your baby's tongue should be between his or her lower gum and your breast.   °· Ensure that your baby's mouth is correctly positioned around your nipple (latched). Your baby's lips should create a seal on your breast and be turned out (everted). °· It is common for your baby to suck about 2-3 minutes in order to start the flow of breast milk. °Latching °Teaching your baby how to latch on to your breast properly is very important. An improper latch can cause nipple pain and decreased milk supply for you and poor weight gain in your baby. Also, if your baby is not latched onto your nipple properly, he or she may swallow some air during feeding. This can make your baby fussy. Burping your baby when you switch breasts during the feeding can help to get rid of the air. However, teaching your baby to latch on properly is still the best way to prevent fussiness from swallowing air while breastfeeding. °Signs that your baby has successfully latched on to your nipple:    °· Silent tugging or silent sucking, without causing you pain.   °· Swallowing heard between every 3-4 sucks.   °·  Muscle movement above and in front of his or her ears while sucking.   °Signs that your baby has not successfully latched on to  nipple:  °· Sucking sounds or smacking sounds from your baby while breastfeeding. °· Nipple pain. °If you think your baby has not latched on correctly, slip your finger into the corner of your baby's mouth to break the suction and place it between your baby's gums. Attempt breastfeeding initiation again. °Signs of Successful Breastfeeding °Signs from your baby:   °· A gradual decrease in the number of sucks or complete cessation of sucking.   °· Falling asleep.   °· Relaxation of his or her body.   °· Retention of a small amount of milk in his or her mouth.   °· Letting go   of your breast by himself or herself. °Signs from you: °· Breasts that have increased in firmness, weight, and size 1-3 hours after feeding.   °· Breasts that are softer immediately after breastfeeding. °· Increased milk volume, as well as a change in milk consistency and color by the fifth day of breastfeeding.   °· Nipples that are not sore, cracked, or bleeding. °Signs That Your Baby is Getting Enough Milk °· Wetting at least 3 diapers in a 24-hour period. The urine should be clear and pale yellow by age 5 days. °· At least 3 stools in a 24-hour period by age 5 days. The stool should be soft and yellow. °· At least 3 stools in a 24-hour period by age 7 days. The stool should be seedy and yellow. °· No loss of weight greater than 10% of birth weight during the first 3 days of age. °· Average weight gain of 4-7 ounces (113-198 g) per week after age 4 days. °· Consistent daily weight gain by age 5 days, without weight loss after the age of 2 weeks. °After a feeding, your baby may spit up a small amount. This is common. °BREASTFEEDING FREQUENCY AND DURATION °Frequent feeding will help you make more milk and can prevent sore nipples and breast engorgement. Breastfeed when you feel the need to reduce the fullness of your breasts or when your baby shows signs of hunger. This is called "breastfeeding on demand." Avoid introducing a pacifier to your  baby while you are working to establish breastfeeding (the first 4-6 weeks after your baby is born). After this time you may choose to use a pacifier. Research has shown that pacifier use during the first year of a baby's life decreases the risk of sudden infant death syndrome (SIDS). °Allow your baby to feed on each breast as long as he or she wants. Breastfeed until your baby is finished feeding. When your baby unlatches or falls asleep while feeding from the first breast, offer the second breast. Because newborns are often sleepy in the first few weeks of life, you may need to awaken your baby to get him or her to feed. °Breastfeeding times will vary from baby to baby. However, the following rules can serve as a guide to help you ensure that your baby is properly fed: °· Newborns (babies 4 weeks of age or younger) may breastfeed every 1-3 hours. °· Newborns should not go longer than 3 hours during the day or 5 hours during the night without breastfeeding. °· You should breastfeed your baby a minimum of 8 times in a 24-hour period until you begin to introduce solid foods to your baby at around 6 months of age. °BREAST MILK PUMPING °Pumping and storing breast milk allows you to ensure that your baby is exclusively fed your breast milk, even at times when you are unable to breastfeed. This is especially important if you are going back to work while you are still breastfeeding or when you are not able to be present during feedings. Your lactation consultant can give you guidelines on how long it is safe to store breast milk.  °A breast pump is a machine that allows you to pump milk from your breast into a sterile bottle. The pumped breast milk can then be stored in a refrigerator or freezer. Some breast pumps are operated by hand, while others use electricity. Ask your lactation consultant which type will work best for you. Breast pumps can be purchased, but some hospitals and breastfeeding support groups   lease  breast pumps on a monthly basis. A lactation consultant can teach you how to hand express breast milk, if you prefer not to use a pump.  °CARING FOR YOUR BREASTS WHILE YOU BREASTFEED °Nipples can become dry, cracked, and sore while breastfeeding. The following recommendations can help keep your breasts moisturized and healthy: °· Avoid using soap on your nipples.   °· Wear a supportive bra. Although not required, special nursing bras and tank tops are designed to allow access to your breasts for breastfeeding without taking off your entire bra or top. Avoid wearing underwire-style bras or extremely tight bras. °· Air dry your nipples for 3-4 minutes after each feeding.   °· Use only cotton bra pads to absorb leaked breast milk. Leaking of breast milk between feedings is normal.   °· Use lanolin on your nipples after breastfeeding. Lanolin helps to maintain your skin's normal moisture barrier. If you use pure lanolin, you do not need to wash it off before feeding your baby again. Pure lanolin is not toxic to your baby. You may also hand express a few drops of breast milk and gently massage that milk into your nipples and allow the milk to air dry. °In the first few weeks after giving birth, some women experience extremely full breasts (engorgement). Engorgement can make your breasts feel heavy, warm, and tender to the touch. Engorgement peaks within 3-5 days after you give birth. The following recommendations can help ease engorgement: °· Completely empty your breasts while breastfeeding or pumping. You may want to start by applying warm, moist heat (in the shower or with warm water-soaked hand towels) just before feeding or pumping. This increases circulation and helps the milk flow. If your baby does not completely empty your breasts while breastfeeding, pump any extra milk after he or she is finished. °· Wear a snug bra (nursing or regular) or tank top for 1-2 days to signal your body to slightly decrease milk  production. °· Apply ice packs to your breasts, unless this is too uncomfortable for you. °· Make sure that your baby is latched on and positioned properly while breastfeeding. °If engorgement persists after 48 hours of following these recommendations, contact your health care provider or a lactation consultant. °OVERALL HEALTH CARE RECOMMENDATIONS WHILE BREASTFEEDING °· Eat healthy foods. Alternate between meals and snacks, eating 3 of each per day. Because what you eat affects your breast milk, some of the foods may make your baby more irritable than usual. Avoid eating these foods if you are sure that they are negatively affecting your baby. °· Drink milk, fruit juice, and water to satisfy your thirst (about 10 glasses a day).   °· Rest often, relax, and continue to take your prenatal vitamins to prevent fatigue, stress, and anemia. °· Continue breast self-awareness checks. °· Avoid chewing and smoking tobacco. °· Avoid alcohol and drug use. °Some medicines that may be harmful to your baby can pass through breast milk. It is important to ask your health care provider before taking any medicine, including all over-the-counter and prescription medicine as well as vitamin and herbal supplements. °It is possible to become pregnant while breastfeeding. If birth control is desired, ask your health care provider about options that will be safe for your baby. °SEEK MEDICAL CARE IF:  °· You feel like you want to stop breastfeeding or have become frustrated with breastfeeding. °· You have painful breasts or nipples. °· Your nipples are cracked or bleeding. °· Your breasts are red, tender, or warm. °· You have   a swollen area on either breast. °· You have a fever or chills. °· You have nausea or vomiting. °· You have drainage other than breast milk from your nipples. °· Your breasts do not become full before feedings by the fifth day after you give birth. °· You feel sad and depressed. °· Your baby is too sleepy to eat  well. °· Your baby is having trouble sleeping.   °· Your baby is wetting less than 3 diapers in a 24-hour period. °· Your baby has less than 3 stools in a 24-hour period. °· Your baby's skin or the white part of his or her eyes becomes yellow.   °· Your baby is not gaining weight by 5 days of age. °SEEK IMMEDIATE MEDICAL CARE IF:  °· Your baby is overly tired (lethargic) and does not want to wake up and feed. °· Your baby develops an unexplained fever. °Document Released: 10/27/2005 Document Revised: 11/01/2013 Document Reviewed: 04/20/2013 °[employer]® Patient Information ©2015 [employer]. This information is not intended to replace advice given to you by your health care provider. Make sure you discuss any questions you have with your health care provider. ° °

## 2014-11-13 NOTE — Discharge Summary (Signed)
Obstetric Discharge Summary  Reason for Admission: Pt is a G4P4001 at [redacted]w[redacted]d for breech and unstable lie / ECV / IOL  Patient has received care at [facility] since 15.2 wks (transfer care from [place]), with Marlinda Mike, CNM as primary provider.  Medications on Admission: Prescriptions prior to admission  Medication Sig Dispense Refill Last Dose  . heparin 13244 UNIT/ML injection Inject 10,000 Units into the skin every 12 (twelve) hours.   11/11/2014 at Unknown time  . acetaminophen (TYLENOL) 500 MG tablet Take 1,000 mg by mouth every 6 (six) hours as needed for headache.   Past Week at Unknown time  . Prenatal Vit-Fe Fumarate-FA (PRENATAL MULTIVITAMIN) TABS tablet Take 1 tablet by mouth daily at 12 noon.   11/08/2014  . [DISCONTINUED] enoxaparin (LOVENOX) 60 MG/0.6ML injection Inject 60 mg into the skin every 12 (twelve) hours.   06/10/2014 at Unknown time    Prenatal Labs: ABO, Rh: O POS (01/02 1355)  Antibody: NEG (01/02 1355) Rubella: Immune (10/16 0000)   RPR: NON REAC (01/02 1355)  HBsAg: Negative (10/16 0000)  HIV: Non-reactive (10/16 0000)  GTT : Normal - 120 mg/dL GBS: Positive (01/02 7253)   Prenatal Procedures: ultrasound Intrapartum Course: Admitted for ECV d/t breech and unstable lie at term / successful ECV by Dr. Billy Coast with no prolonged fetal distress / IOL with membrane stripping, cervical balloon and AROM / normal progression to complete dilation / SVD of viable female over intact perineum by Marlinda Mike, CNM / no immediate postpartum complications noted Intrapartum Procedures: spontaneous vaginal delivery Postpartum Procedures: none Complications-Operative and Postpartum: On Lovenox for genetic thrombophilia  Labs: HEMOGLOBIN  Date Value Ref Range Status  11/11/2014 10.7* 12.0 - 15.0 g/dL Final   HGB  Date Value Ref Range Status  05/22/2010 13.5 11.6 - 15.9 g/dL Final   HCT  Date Value Ref Range Status  11/11/2014 32.7* 36.0 - 46.0 % Final   05/22/2010 38.5 34.8 - 46.6 % Final   Lab Results  Component Value Date   PLT 177 11/11/2014    Newborn Data: Live born female  Birth Weight: 9 lb 8 oz (4310 g) APGAR: 7, 8  Home with mother.   Discharge Information: Date: 11/13/2014 Discharge Diagnoses:  Pt is a G4P4001 at [redacted]w[redacted]d S/P Term Pregnancy-delivered on 11/12/2014  Condition: stable Activity: pelvic rest Diet: routine Medications:    Medication List    STOP taking these medications        heparin 66440 UNIT/ML injection      TAKE these medications        acetaminophen 500 MG tablet  Commonly known as:  TYLENOL  Take 1,000 mg by mouth every 6 (six) hours as needed for headache.     enoxaparin 60 MG/0.6ML injection  Commonly known as:  LOVENOX  Inject 0.4 mLs (40 mg total) into the skin every 12 (twelve) hours.     iron polysaccharides 150 MG capsule  Commonly known as:  NIFEREX  Take 1 capsule (150 mg total) by mouth daily.     magnesium oxide 400 (241.3 MG) MG tablet  Commonly known as:  MAG-OX  Take 0.5 tablets (200 mg total) by mouth daily.     prenatal multivitamin Tabs tablet  Take 1 tablet by mouth daily at 12 noon.       Instructions: The [facility] instruction booklet has been given and reviewed Discharge to: home     Follow-up Information    Follow up with Marlinda Mike, CNM.  Schedule an appointment as soon as possible for a visit in 2 weeks.   Specialty:  Obstetrics and Gynecology   Why:  re-evaluation   Contact information:   [address] [place] [address] [phone]       Follow up with Marlinda Mike, CNM. Schedule an appointment as soon as possible for a visit in 6 weeks.   Specialty:  Obstetrics and Gynecology   Why:  postpartum visit   Contact information:   [address] [place] [address] [phone]       Raelyn Mora, Judie Petit, MSN, CNM 11/13/2014, 10:22 AM

## 2014-11-13 NOTE — Progress Notes (Signed)
Patient ID: Jasmin Cooper, female   DOB: 10-Dec-1990, 24 y.o.   MRN: [id number] PPD # 1 SVD  S:  Reports feeling well and ready for early discharge today             Tolerating po/ No nausea or vomiting             Bleeding is light             Pain controlled with none             Up ad lib / ambulatory / voiding without difficulties    Newborn  Information for the patient's newborn:  [name], Porta [[id number]]  female  breast feeding  / Circumcision in progress   O:  A & O x 3, in no apparent distress              VS:  Filed Vitals:   11/12/14 0436 11/12/14 0548 11/12/14 0954 11/13/14 0554  BP: 121/60 116/62 115/71 114/62  Pulse: 66 70 55 58  Temp: 98.3 F (36.8 C) 98.2 F (36.8 C) 97.9 F (36.6 C) 98 F (36.7 C)  TempSrc: Oral Oral Oral Oral  Resp: Height:      Weight:        LABS:  Recent Labs  11/11/14 1355  WBC 5.9  HGB 10.7*  HCT 32.7*  PLT 177    Blood type: O POS (01/02 1355)  Rubella: Immune (10/16 0000)     Lungs: Clear and unlabored  Heart: regular rate and rhythm / no murmurs  Abdomen: soft, non-tender, non-distended              Fundus: firm, non-tender, U-1  Perineum: Intact, no edema  Lochia: minimal  Extremities: trace edema, no calf pain or tenderness, no Homans - TED hose in place    A/P: PPD # 1  23 y.o., [id number]   Principal Problem:    Postpartum care following vaginal delivery (1/3)  Active Problems:    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
